# Patient Record
Sex: Male | Born: 1955
Health system: Southern US, Community
[De-identification: ages and names within clinical notes are randomized; demographics above are authoritative.]

## PROBLEM LIST (undated history)

## (undated) DIAGNOSIS — H269 Unspecified cataract: Secondary | ICD-10-CM

## (undated) DIAGNOSIS — M47816 Spondylosis without myelopathy or radiculopathy, lumbar region: Secondary | ICD-10-CM

## (undated) DIAGNOSIS — C801 Malignant (primary) neoplasm, unspecified: Secondary | ICD-10-CM

## (undated) DIAGNOSIS — C4490 Unspecified malignant neoplasm of skin, unspecified: Secondary | ICD-10-CM

## (undated) DIAGNOSIS — T7840XA Allergy, unspecified, initial encounter: Secondary | ICD-10-CM

## (undated) DIAGNOSIS — S22000A Wedge compression fracture of unspecified thoracic vertebra, initial encounter for closed fracture: Secondary | ICD-10-CM

## (undated) DIAGNOSIS — C439 Malignant melanoma of skin, unspecified: Secondary | ICD-10-CM

## (undated) DIAGNOSIS — Z86010 Personal history of colonic polyps: Secondary | ICD-10-CM

## (undated) DIAGNOSIS — T782XXA Anaphylactic shock, unspecified, initial encounter: Secondary | ICD-10-CM

## (undated) DIAGNOSIS — T63481A Toxic effect of venom of other arthropod, accidental (unintentional), initial encounter: Secondary | ICD-10-CM

## (undated) DIAGNOSIS — E119 Type 2 diabetes mellitus without complications: Secondary | ICD-10-CM

## (undated) DIAGNOSIS — R55 Syncope and collapse: Secondary | ICD-10-CM

## (undated) DIAGNOSIS — I10 Essential (primary) hypertension: Secondary | ICD-10-CM

## (undated) DIAGNOSIS — D8940 Mast cell activation, unspecified: Secondary | ICD-10-CM

## (undated) DIAGNOSIS — E785 Hyperlipidemia, unspecified: Secondary | ICD-10-CM

## (undated) DIAGNOSIS — K219 Gastro-esophageal reflux disease without esophagitis: Secondary | ICD-10-CM

## (undated) HISTORY — DX: Spondylosis without myelopathy or radiculopathy, lumbar region: M47.816

## (undated) HISTORY — DX: Gastro-esophageal reflux disease without esophagitis: K21.9

## (undated) HISTORY — DX: Mast cell activation, unspecified: D89.40

## (undated) HISTORY — PX: MOHS SURGERY: SUR867

## (undated) HISTORY — PX: EYE SURGERY: SHX253

## (undated) HISTORY — PX: RETINAL DETACHMENT SURGERY: SHX105

## (undated) HISTORY — DX: Unspecified cataract: H26.9

## (undated) HISTORY — DX: Wedge compression fracture of unspecified thoracic vertebra, initial encounter for closed fracture: S22.000A

## (undated) HISTORY — PX: KNEE SURGERY: SHX244

## (undated) HISTORY — DX: Toxic effect of venom of other arthropod, accidental (unintentional), initial encounter: T78.2XXA

## (undated) HISTORY — PX: SKIN BIOPSY: SHX1

## (undated) HISTORY — PX: APPENDECTOMY: SHX54

## (undated) HISTORY — DX: Allergy, unspecified, initial encounter: T78.40XA

## (undated) HISTORY — PX: LASIK: SHX215

## (undated) HISTORY — PX: CATARACT EXTRACTION: SUR2

## (undated) HISTORY — DX: Hyperlipidemia, unspecified: E78.5

## (undated) HISTORY — PX: COLONOSCOPY: SHX174

## (undated) HISTORY — DX: Toxic effect of venom of other arthropod, accidental (unintentional), initial encounter: T63.481A

## (undated) HISTORY — DX: Malignant melanoma of skin, unspecified: C43.9

---
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
## 1983-03-12 HISTORY — PX: KNEE SURGERY: SHX244

## 2001-02-04 ENCOUNTER — Ambulatory Visit (HOSPITAL_COMMUNITY): Admission: RE | Admit: 2001-02-04 | Discharge: 2001-02-04 | Payer: Self-pay | Admitting: Orthopedic Surgery

## 2001-02-04 ENCOUNTER — Encounter: Payer: Self-pay | Admitting: Orthopedic Surgery

## 2001-03-12 ENCOUNTER — Encounter: Payer: Self-pay | Admitting: Orthopedic Surgery

## 2001-03-12 ENCOUNTER — Encounter: Admission: RE | Admit: 2001-03-12 | Discharge: 2001-03-12 | Payer: Self-pay | Admitting: Orthopedic Surgery

## 2013-03-23 ENCOUNTER — Other Ambulatory Visit: Payer: Self-pay | Admitting: Vascular Surgery

## 2013-03-23 DIAGNOSIS — I70229 Atherosclerosis of native arteries of extremities with rest pain, unspecified extremity: Secondary | ICD-10-CM

## 2013-03-24 ENCOUNTER — Encounter (HOSPITAL_COMMUNITY): Payer: Self-pay

## 2013-03-24 ENCOUNTER — Encounter: Payer: Self-pay | Admitting: Vascular Surgery

## 2014-04-16 ENCOUNTER — Encounter (HOSPITAL_COMMUNITY): Payer: Self-pay | Admitting: *Deleted

## 2014-04-16 ENCOUNTER — Emergency Department (HOSPITAL_COMMUNITY)
Admission: EM | Admit: 2014-04-16 | Discharge: 2014-04-17 | Disposition: A | Discharge status: Home / Self Care | Payer: 59 | Attending: Emergency Medicine | Admitting: Emergency Medicine

## 2014-04-16 DIAGNOSIS — R531 Weakness: Secondary | ICD-10-CM | POA: Diagnosis not present

## 2014-04-16 DIAGNOSIS — E119 Type 2 diabetes mellitus without complications: Secondary | ICD-10-CM | POA: Insufficient documentation

## 2014-04-16 DIAGNOSIS — Z859 Personal history of malignant neoplasm, unspecified: Secondary | ICD-10-CM | POA: Insufficient documentation

## 2014-04-16 DIAGNOSIS — R197 Diarrhea, unspecified: Secondary | ICD-10-CM | POA: Insufficient documentation

## 2014-04-16 DIAGNOSIS — Z7982 Long term (current) use of aspirin: Secondary | ICD-10-CM | POA: Diagnosis not present

## 2014-04-16 DIAGNOSIS — I10 Essential (primary) hypertension: Secondary | ICD-10-CM | POA: Insufficient documentation

## 2014-04-16 DIAGNOSIS — T7840XA Allergy, unspecified, initial encounter: Secondary | ICD-10-CM | POA: Insufficient documentation

## 2014-04-16 DIAGNOSIS — R21 Rash and other nonspecific skin eruption: Secondary | ICD-10-CM | POA: Insufficient documentation

## 2014-04-16 DIAGNOSIS — Z79899 Other long term (current) drug therapy: Secondary | ICD-10-CM | POA: Insufficient documentation

## 2014-04-16 HISTORY — DX: Essential (primary) hypertension: I10

## 2014-04-16 HISTORY — DX: Malignant (primary) neoplasm, unspecified: C80.1

## 2014-04-16 HISTORY — DX: Type 2 diabetes mellitus without complications: E11.9

## 2014-04-16 NOTE — ED Notes (Signed)
Pt reports have 3 bowel movements, (solid, to semi solid, then liquid.) after that he started itching to his scalp & hands. Pt used benadryl & his eppi pen. Pt became weak & dizzy. Per EMS pt BP was low but when got him in truck had returned to normal.

## 2014-04-16 NOTE — ED Notes (Signed)
Pt took 4 asa at home given by his wife.

## 2014-04-16 NOTE — ED Provider Notes (Signed)
CSN: [id number]     Arrival date & time 04/16/14  2312 History   First MD Initiated Contact with Patient 04/16/14 2322     Chief Complaint  Patient presents with  . Weakness  . Allergic Reaction     (Consider location/radiation/quality/duration/timing/severity/associated sxs/prior Treatment) Patient is a 59 y.o. male presenting with allergic reaction. The history is provided by the patient.  Allergic Reaction Presenting symptoms: itching and rash   Severity:  Moderate Prior allergic episodes:  Insect allergies Relieved by:  Antihistamines and epinephrine Worsened by:  Nothing tried  Douglas Nunez is a 59 y.o. male who presents to the ED via EMS for an allergic reaction. He states that he ate dinner and immediately had to have a BM and it was normal. just a few minutes later he had to go again and it was loose. The third time it was like water and then he reports itching to his hands and scalp. He took benadryl and used his epi pen. He began feeling weak and dizzy. EMS reports the patient's BP was low initially but then returned to normal. Patient has had life threating allergic reactions in the past and they all start the same way with scalp itching,hands and arms and then starts on the chest and that is the reason he used his epi pen. Patient reports at this time he is feeling normal and has his energy back. Patient denies feeling short of breath or difficulty swallowing. The itching has improved. The patient's wife reports that the patient became very weak and his BP dropped during the event so she called EMS.   Past Medical History  Diagnosis Date  . Hypertension   . Diabetes mellitus without complication   . Cancer    Past Surgical History  Procedure Laterality Date  . Appendectomy    . Knee surgery    . Skin biopsy     No family history on file. History  Substance Use Topics  . Smoking status: Never Smoker   . Smokeless tobacco: Not on file  . Alcohol Use: Yes   Comment: occ    Review of Systems  Gastrointestinal: Positive for diarrhea. Abdominal pain: cramping before the diarrhea.  Skin: Positive for itching and rash.  generalized weakness that has now resolved Negative except as stated in HPI   Allergies  Bee venom and Nsaids  Home Medications   Prior to Admission medications   Medication Sig Start Date End Date Taking? Authorizing Provider  amLODipine (NORVASC) 5 MG tablet Take 5 mg by mouth daily.   Yes Historical Provider, MD  aspirin 81 MG tablet Take 81 mg by mouth daily.   Yes Historical Provider, MD  hydrochlorothiazide (HYDRODIURIL) 12.5 MG tablet Take 12.5 mg by mouth daily.   Yes Historical Provider, MD  metFORMIN (GLUCOPHAGE) 500 MG tablet Take by mouth daily.   Yes Historical Provider, MD  ranitidine (ZANTAC) 150 MG tablet Take 150 mg by mouth daily.   Yes Historical Provider, MD   BP 144/85 mmHg  Pulse 100  Temp(Src) 98.5 F (36.9 C) (Oral)  Resp 16  Ht 5' 9.5" (1.765 m)  Wt 203 lb (92.08 kg)  BMI 29.56 kg/m2  SpO2 94% Physical Exam  Constitutional: He is oriented to person, place, and time. He appears well-developed and well-nourished. No distress.  HENT:  Head: Normocephalic.  Mouth/Throat: Uvula is midline, oropharynx is clear and moist and mucous membranes are normal.  Eyes: Conjunctivae and EOM are normal.  Neck: Normal  range of motion. Neck supple.  Cardiovascular: Normal rate and regular rhythm.   Pulmonary/Chest: Effort normal. No respiratory distress. He has no wheezes. He has no rales.  Abdominal: Soft. There is no tenderness.  Musculoskeletal: Normal range of motion.  Neurological: He is alert and oriented to person, place, and time. No cranial nerve deficit.  Skin: Skin is warm and dry.  Psychiatric: He has a normal mood and affect. His behavior is normal. Thought content normal.  Nursing note and vitals reviewed.   ED Course  Procedures  I discussed this case with Dr. Leonides Schanz and we will observe the  patient for the next 3 hours to observe for any rebound after his administration of of epinephrine.  MDM  59 y.o. male with allergic reaction prior to arrival to the ED that patient self administered epi pen and benadryl. Stable since arrival to the ED. Will continue to observe for 4 hours s/p epinephrine. Discussed plan with the patient. Care turned over to Dr. Leonides Schanz at [place] am.      Ashley Murrain, NP 04/17/14 [place], DO 04/17/14 [phone]

## 2014-04-17 MED ORDER — PREDNISONE 20 MG PO TABS: 40.0000 mg | ORAL_TABLET | Freq: Every day | ORAL | Status: DC

## 2014-04-17 MED ORDER — PREDNISONE 20 MG PO TABS
40.0000 mg | ORAL_TABLET | Freq: Once | ORAL | Status: AC
  Administered 2014-04-17: 40 mg via ORAL
  Filled 2014-04-17: qty 2

## 2014-04-17 NOTE — Discharge Instructions (Signed)
Epinephrine Injection Epinephrine is a medicine given by injection to temporarily treat an emergency allergic reaction. It is also used to treat severe asthmatic attacks and other lung problems. The medicine helps to enlarge (dilate) the small breathing tubes of the lungs. A life-threatening, sudden allergic reaction that involves the whole body is called anaphylaxis. Because of potential side effects, epinephrine should only be used as directed by your caregiver. RISKS AND COMPLICATIONS Possible side effects of epinephrine injections include:  Chest pain.  Irregular or rapid heartbeat.  Shortness of breath.  Nausea.  Vomiting.  Abdominal pain or cramping.  Sweating.  Dizziness.  Weakness.  Headache.  Nervousness. Report all side effects to your caregiver. HOW TO GIVE AN EPINEPHRINE INJECTION Give the epinephrine injection immediately when symptoms of a severe reaction begin. Inject the medicine into the outer thigh or any available, large muscle. Your caregiver can teach you how to do this. You do not need to remove any clothing. After the injection, call your local emergency services (911 in [place] Even if you improve after the injection, you need to be examined at a hospital emergency department. Epinephrine works quickly, but it also wears off quickly. Delayed reactions can occur. A delayed reaction may be as serious and dangerous as the initial reaction. HOME CARE INSTRUCTIONS  Make sure you and your family know how to give an epinephrine injection.  Use epinephrine injections as directed by your caregiver. Do not use this medicine more often or in larger doses than prescribed.  Always carry your epinephrine injection or anaphylaxis kit with you. This can be lifesaving if you have a severe reaction.  Store the medicine in a cool, dry place. If the medicine becomes discolored or cloudy, dispose of it properly and replace it with new medicine.  Check the expiration date on  your medicine. It may be unsafe to use medicines past their expiration date.  Tell your caregiver about any other medicines you are taking. Some medicines can react badly with epinephrine.  Tell your caregiver about any medical conditions you have, such as diabetes, high blood pressure (hypertension), heart disease, irregular heartbeats, or if you are pregnant. SEEK IMMEDIATE MEDICAL CARE IF:  You have used an epinephrine injection. Call your local emergency services (911 in [place] Even if you improve after the injection, you need to be examined at a hospital emergency department to make sure your allergic reaction is under control. You will also be monitored for adverse effects from the medicine.  You have chest pain.  You have irregular or fast heartbeats.  You have shortness of breath.  You have severe headaches.  You have severe nausea, vomiting, or abdominal cramps.  You have severe pain, swelling, or redness in the area where you gave the injection. Document Released: 02/23/2000 Document Revised: 05/20/2011 Document Reviewed: 11/14/2010 [facility] Patient Information 2015 [place], [place]. This information is not intended to replace advice given to you by your health care provider. Make sure you discuss any questions you have with your health care provider.   Anaphylactic Reaction An anaphylactic reaction is a sudden, severe allergic reaction that involves the whole body. It can be life threatening. A hospital stay is often required. People with asthma, eczema, or hay fever are slightly more likely to have an anaphylactic reaction. CAUSES  An anaphylactic reaction may be caused by anything to which you are allergic. After being exposed to the allergic substance, your immune system becomes sensitized to it. When you are exposed to that allergic substance  again, an allergic reaction can occur. Common causes of an anaphylactic reaction include:  Medicines.  Foods, especially peanuts,  wheat, shellfish, milk, and eggs.  Insect bites or stings.  Blood products.  Chemicals, such as dyes, latex, and contrast material used for imaging tests. SYMPTOMS  When an allergic reaction occurs, the body releases histamine and other substances. These substances cause symptoms such as tightening of the airway. Symptoms often develop within seconds or minutes of exposure. Symptoms may include:  Skin rash or hives.  Itching.  Chest tightness.  Swelling of the eyes, tongue, or lips.  Trouble breathing or swallowing.  Lightheadedness or fainting.  Anxiety or confusion.  Stomach pains, vomiting, or diarrhea.  Nasal congestion.  A fast or irregular heartbeat (palpitations). DIAGNOSIS  Diagnosis is based on your history of recent exposure to allergic substances, your symptoms, and a physical exam. Your caregiver may also perform blood or urine tests to confirm the diagnosis. TREATMENT  Epinephrine medicine is the main treatment for an anaphylactic reaction. Other medicines that may be used for treatment include antihistamines, steroids, and albuterol. In severe cases, fluids and medicine to support blood pressure may be given through an intravenous line (IV). Even if you improve after treatment, you need to be observed to make sure your condition does not get worse. This may require a stay in the hospital. [place] a medical alert bracelet or necklace stating your allergy.  You and your family must learn how to use an anaphylaxis kit or give an epinephrine injection to temporarily treat an emergency allergic reaction. Always carry your epinephrine injection or anaphylaxis kit with you. This can be lifesaving if you have a severe reaction.  Do not drive or perform tasks after treatment until the medicines used to treat your reaction have worn off, or until your caregiver says it is okay.  If you have hives or a rash:  Take medicines as directed by your  caregiver.  You may use an over-the-counter antihistamine (diphenhydramine) as needed.  Apply cold compresses to the skin or take baths in cool water. Avoid hot baths or showers. SEEK MEDICAL CARE IF:   You develop symptoms of an allergic reaction to a new substance. Symptoms may start right away or minutes later.  You develop a rash, hives, or itching.  You develop new symptoms. SEEK IMMEDIATE MEDICAL CARE IF:   You have swelling of the mouth, difficulty breathing, or wheezing.  You have a tight feeling in your chest or throat.  You develop hives, swelling, or itching all over your body.  You develop severe vomiting or diarrhea.  You feel faint or pass out. This is an emergency. Use your epinephrine injection or anaphylaxis kit as you have been instructed. Call your local emergency services (911 in [place] Even if you improve after the injection, you need to be examined at a hospital emergency department. MAKE SURE YOU:   Understand these instructions.  Will watch your condition.  Will get help right away if you are not doing well or get worse. Document Released: 02/25/2005 Document Revised: 03/02/2013 Document Reviewed: 05/29/2011 [facility] Patient Information 2015 [place], [place]. This information is not intended to replace advice given to you by your health care provider. Make sure you discuss any questions you have with your health care provider.

## 2014-04-17 NOTE — ED Notes (Signed)
Discharge instructions given, pt demonstrated teach back and verbal understanding. No concerns voiced.  

## 2014-09-08 ENCOUNTER — Ambulatory Visit (INDEPENDENT_AMBULATORY_CARE_PROVIDER_SITE_OTHER): Payer: 59 | Admitting: Internal Medicine

## 2014-09-08 ENCOUNTER — Other Ambulatory Visit: Payer: Self-pay | Admitting: Internal Medicine

## 2014-09-08 ENCOUNTER — Encounter: Payer: Self-pay | Admitting: Internal Medicine

## 2014-09-08 VITALS — BP 159/97 | HR 81 | Temp 98.2°F | Ht 69.0 in | Wt 202.0 lb

## 2014-09-08 DIAGNOSIS — E119 Type 2 diabetes mellitus without complications: Secondary | ICD-10-CM

## 2014-09-08 DIAGNOSIS — H3320 Serous retinal detachment, unspecified eye: Secondary | ICD-10-CM | POA: Insufficient documentation

## 2014-09-08 DIAGNOSIS — K219 Gastro-esophageal reflux disease without esophagitis: Secondary | ICD-10-CM | POA: Diagnosis not present

## 2014-09-08 DIAGNOSIS — H3570 Unspecified separation of retinal layers: Secondary | ICD-10-CM | POA: Diagnosis not present

## 2014-09-08 DIAGNOSIS — E1310 Other specified diabetes mellitus with ketoacidosis without coma: Secondary | ICD-10-CM

## 2014-09-08 DIAGNOSIS — I152 Hypertension secondary to endocrine disorders: Secondary | ICD-10-CM | POA: Insufficient documentation

## 2014-09-08 DIAGNOSIS — Z794 Long term (current) use of insulin: Secondary | ICD-10-CM | POA: Insufficient documentation

## 2014-09-08 DIAGNOSIS — I10 Essential (primary) hypertension: Secondary | ICD-10-CM | POA: Diagnosis not present

## 2014-09-08 DIAGNOSIS — H3321 Serous retinal detachment, right eye: Secondary | ICD-10-CM

## 2014-09-08 DIAGNOSIS — E1110 Type 2 diabetes mellitus with ketoacidosis without coma: Secondary | ICD-10-CM | POA: Insufficient documentation

## 2014-09-08 DIAGNOSIS — E1159 Type 2 diabetes mellitus with other circulatory complications: Secondary | ICD-10-CM | POA: Insufficient documentation

## 2014-09-08 DIAGNOSIS — E1165 Type 2 diabetes mellitus with hyperglycemia: Secondary | ICD-10-CM | POA: Insufficient documentation

## 2014-09-08 LAB — COMPREHENSIVE METABOLIC PANEL
ALBUMIN: 4.1 g/dL (ref 3.5–5.2)
ALT: 24 U/L (ref 0–53)
AST: 17 U/L (ref 0–37)
Alkaline Phosphatase: 60 U/L (ref 39–117)
BUN: 15 mg/dL (ref 6–23)
CALCIUM: 9.3 mg/dL (ref 8.4–10.5)
CO2: 25 mEq/L (ref 19–32)
Chloride: 101 mEq/L (ref 96–112)
Creat: 0.93 mg/dL (ref 0.50–1.35)
GLUCOSE: 300 mg/dL — AB (ref 70–99)
POTASSIUM: 4 meq/L (ref 3.5–5.3)
SODIUM: 137 meq/L (ref 135–145)
TOTAL PROTEIN: 7 g/dL (ref 6.0–8.3)
Total Bilirubin: 0.5 mg/dL (ref 0.2–1.2)

## 2014-09-08 LAB — CBC WITH DIFFERENTIAL/PLATELET
BASOS PCT: 1 % (ref 0–1)
Basophils Absolute: 0.1 10*3/uL (ref 0.0–0.1)
EOS ABS: 0.2 10*3/uL (ref 0.0–0.7)
EOS PCT: 3 % (ref 0–5)
HCT: 45.3 % (ref 39.0–52.0)
Hemoglobin: 15.4 g/dL (ref 13.0–17.0)
LYMPHS ABS: 2 10*3/uL (ref 0.7–4.0)
Lymphocytes Relative: 34 % (ref 12–46)
MCH: 30.4 pg (ref 26.0–34.0)
MCHC: 34 g/dL (ref 30.0–36.0)
MCV: 89.5 fL (ref 78.0–100.0)
MONO ABS: 0.2 10*3/uL (ref 0.1–1.0)
MONOS PCT: 4 % (ref 3–12)
MPV: 10.1 fL (ref 8.6–12.4)
NEUTROS PCT: 58 % (ref 43–77)
Neutro Abs: 3.5 10*3/uL (ref 1.7–7.7)
PLATELETS: 219 10*3/uL (ref 150–400)
RBC: 5.06 MIL/uL (ref 4.22–5.81)
RDW: 13.2 % (ref 11.5–15.5)
WBC: 6 10*3/uL (ref 4.0–10.5)

## 2014-09-08 NOTE — Progress Notes (Signed)
Patient ID: Douglas Nunez, male   DOB: 1955/04/19, 59 y.o.   MRN: [id number]         [facility] for Infectious Disease  Reason for Consult: Possible parasitic infection of right eye Referring Physician: Dr. Sherlynn Stalls  Patient Active Problem List   Diagnosis Date Noted  . Retinal detachment 09/08/2014    Priority: High  . HTN (hypertension) 09/08/2014  . DM (diabetes mellitus) type 2, uncontrolled, with ketoacidosis 09/08/2014  . GERD (gastroesophageal reflux disease) 09/08/2014    Patient's Medications  New Prescriptions   No medications on file  Previous Medications   AMLODIPINE (NORVASC) 5 MG TABLET    Take 5 mg by mouth daily.   ASPIRIN 81 MG TABLET    Take 81 mg by mouth daily.   HYDROCHLOROTHIAZIDE (HYDRODIURIL) 12.5 MG TABLET    Take 12.5 mg by mouth daily.   METFORMIN (GLUCOPHAGE) 500 MG TABLET    Take by mouth daily.   RANITIDINE (ZANTAC) 150 MG TABLET    Take 150 mg by mouth daily.  Modified Medications   No medications on file  Discontinued Medications   PREDNISONE (DELTASONE) 20 MG TABLET    Take 2 tablets (40 mg total) by mouth daily.    Recommendations: 1. CBC with differential and complete metabolic panel 2. Serologic testing for Taenia, Toxocara and Trichinella  3. Stool for ova and parasite evaluation 4. Follow-up in 3 weeks  Assessment: This is a very unusual story. Unfortunately without actual confirmation that the linear structure was seen is a parasite it is very difficult to know what to do with this information. His travel history does not suggest that he has it at high risk for unusual parasites. There have been rare reports of various types of eye involvement with [place] parasite such as Toxocara, Taenia and Trichinella. I will obtain a CBC with differential to see if he has any evidence of eosinophilia as well as basic chemistries. I will also obtain serologic testing and stool for ova and parasite evaluation.  HPI: Douglas Nunez is a 59 y.o. male who developed sudden change in the vision in his right eye in early May. He noticed floating shadows in his superior visual field. Otherwise his vision was clear. He was seen by his ophthalmologist and retinal images revealed an apparent retinal tear. He is referred to Dr. Sherlynn Stalls who operated on him on 07/21/2014. According to his operative "the subretinal fluid was found to be extraordinarily viscous. Once the subretinal fluid was removed, there was a linear white structure underneath the retina that resembled a code that was observed to be moving. This hematoma and was responding to stimulus. The pneumatosis was observed with the microscope and removed by extracting it from the subretinal space." The specimen was taken directly to the laboratory. Interestingly enough a final report said that no worms, ova or parasites were seen.  He has no known previous infection with worms or parasites. He has worked for 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 During the 1980s and 90s he travels fairly extensively in [place], [place] and [place]. In the past he has also taken occasional trips to [place] and resorts in [place] for scuba diving trips. He has never had any travel to [place], [place], the [place] or [place] countries or [place]. He does not eat sushi or other raw meats or fish. He does like to hunt and fish.  Review of Systems: Constitutional: negative for anorexia, chills, fevers, sweats and weight loss Eyes: positive for as noted in history of present illness Ears, nose, mouth, throat, and face: negative Respiratory: negative Cardiovascular: negative Gastrointestinal: negative Genitourinary:negative    Past Medical History  Diagnosis Date  . Hypertension   . Diabetes mellitus without complication   . Cancer   . Allergy     History  Substance Use Topics  . Smoking status: Never  Smoker   . Smokeless tobacco: Never Used  . Alcohol Use: 0.0 oz/week    0 Standard drinks or equivalent per week     Comment: occ    Family History  Problem Relation Age of Onset  . Diabetes Father    Allergies  Allergen Reactions  . Bee Venom Anaphylaxis  . Nsaids     OBJECTIVE: Blood pressure 159/97, pulse 81, temperature 98.2 F (36.8 C), temperature source Oral, height 5\' 9"  (1.753 m), weight 202 lb (91.627 kg). General: He is healthy-appearing and in no distress. Skin: No rash Lymph nodes: No palpable adenopathy Lungs: Clear Cor: Regular S1 and S2 with no murmurs Abdomen: Soft and nontender with no palpable masses   Microbiology: No results found for this or any previous visit (from the past 240 hour(s)).  Michel Bickers, MD [facility] [place] [facility] [phone] pager   [phone] cell 09/08/2014, 5:48 PM

## 2014-09-09 ENCOUNTER — Other Ambulatory Visit: Payer: 59

## 2014-09-11 LAB — CYSTICERCOSIS AB, IGG BY ELISA
Cysticercosis Ab, IgG: <0.9
Cysticercosis Ab, IgG: <0.9

## 2014-09-13 LAB — OVA AND PARASITE EXAMINATION: OP: NONE SEEN

## 2014-09-14 LAB — TOXOCARA ANTIBODIES, BLD

## 2014-09-21 LAB — OTHER SOLSTAS TEST

## 2014-09-29 ENCOUNTER — Ambulatory Visit: Payer: 59 | Admitting: Internal Medicine

## 2014-10-17 ENCOUNTER — Encounter: Payer: Self-pay | Admitting: Internal Medicine

## 2014-10-17 ENCOUNTER — Ambulatory Visit (INDEPENDENT_AMBULATORY_CARE_PROVIDER_SITE_OTHER): Payer: 59 | Admitting: Internal Medicine

## 2014-10-17 VITALS — BP 158/102 | HR 92 | Temp 98.3°F | Wt 199.0 lb

## 2014-10-17 DIAGNOSIS — H3321 Serous retinal detachment, right eye: Secondary | ICD-10-CM

## 2014-10-17 DIAGNOSIS — H3570 Unspecified separation of retinal layers: Secondary | ICD-10-CM

## 2014-10-17 NOTE — Assessment & Plan Note (Signed)
At this point I cannot determine what the moving, linear structure was that was seen at the time of his recent eye surgery. Direct examination of fluid from the eye did not confirm any evidence of parasites or other infection. No parasites were found in a stool specimen. He has no serologic evidence of past Toxocara, Trichinella or cysticerci infection and no evidence of eosinophilia. I do not feel that there is any further diagnostic evaluation that needs to be done at this time. I doubt that he will develop symptoms related to parasitic infection in the future.

## 2014-10-17 NOTE — Progress Notes (Signed)
Douglas Nunez for Infectious Disease  Patient Active Problem List   Diagnosis Date Noted  . Retinal detachment 09/08/2014    Priority: High  . HTN (hypertension) 09/08/2014  . DM (diabetes mellitus) type 2, uncontrolled, with ketoacidosis 09/08/2014  . GERD (gastroesophageal reflux disease) 09/08/2014    Patient's Medications  New Prescriptions   No medications on file  Previous Medications   AMLODIPINE (NORVASC) 5 MG TABLET    Take 5 mg by mouth daily.   ASPIRIN 81 MG TABLET    Take 81 mg by mouth daily.   HYDROCHLOROTHIAZIDE (HYDRODIURIL) 12.5 MG TABLET    Take 12.5 mg by mouth daily.   METFORMIN (GLUCOPHAGE) 500 MG TABLET    Take by mouth daily.   RANITIDINE (ZANTAC) 150 MG TABLET    Take 150 mg by mouth daily.  Modified Medications   No medications on file  Discontinued Medications   No medications on file    Subjective: Douglas Nunez is in for his routine follow-up visit. developed sudden change in the vision in his right eye in early May. He noticed floating shadows in his superior visual field. Otherwise his vision was clear. He was seen by his ophthalmologist and retinal images revealed an apparent retinal tear. He is referred to Dr. Sherlynn Stalls who operated on him on 07/21/2014. According to his operative "the subretinal fluid was found to be extraordinarily viscous. Once the subretinal fluid was removed, there was a linear white structure underneath the retina that resembled a nematode that was observed to be moving. This nematode was responding to stimulus. The nematode was observed with the microscope and removed by extracting it from the subretinal space." The specimen was taken directly to the laboratory. Interestingly enough a final report said that no worms, ova or parasites were seen. At the time of his initial visit with me one month ago no new visual findings were noted and he had no unusual risk factors for parasitic infection. Other than being worried  about what this means he has not had any new problems since his last visit. He is working with Dr. Leslie Andrea, his primary care physician, to get his blood sugars under better control.  Review of Systems: Pertinent items are noted in HPI.  Past Medical History  Diagnosis Date  . Hypertension   . Diabetes mellitus without complication   . Cancer   . Allergy     History  Substance Use Topics  . Smoking status: Never Smoker   . Smokeless tobacco: Never Used  . Alcohol Use: 0.0 oz/week    0 Standard drinks or equivalent per week     Comment: occ    Family History  Problem Relation Age of Onset  . Diabetes Father     Allergies  Allergen Reactions  . Bee Venom Anaphylaxis  . Nsaids     Objective: Filed Vitals:   10/17/14 1530 10/17/14 1538  BP: 162/99 158/102  Pulse: 87 92  Temp: 98.3 F (36.8 C)   TempSrc: Oral   Weight: 199 lb (90.266 kg)    Body mass index is 29.37 kg/(m^2).  General: He is in no distress Skin: No rash Eyes: Normal external exam Lungs: Clear Cor: Regular S1 and S2 with no murmurs   Lab Results Lab Results  Component Value Date   WBC 6.0 09/08/2014   HGB 15.4 09/08/2014   HCT 45.3 09/08/2014   MCV 89.5 09/08/2014   PLT 219 09/08/2014  no eosinophilia noted  CMP     Component Value Date/Time   NA 137 09/08/2014 1621   K 4.0 09/08/2014 1621   CL 101 09/08/2014 1621   CO2 25 09/08/2014 1621   GLUCOSE 300* 09/08/2014 1621   BUN 15 09/08/2014 1621   CREATININE 0.93 09/08/2014 1621   CALCIUM 9.3 09/08/2014 1621   PROT 7.0 09/08/2014 1621   ALBUMIN 4.1 09/08/2014 1621   AST 17 09/08/2014 1621   ALT 24 09/08/2014 1621   ALKPHOS 60 09/08/2014 1621   BILITOT 0.5 09/08/2014 1621   Toxocara, Trichinella and cysticerci antibodies are all negative Stool examination negative for ova and parasites   Problem List Items Addressed This Visit      High   Retinal detachment - Primary    At this point I cannot determine what the  moving, linear structure was that was seen at the time of his recent eye surgery. Direct examination of fluid from the eye did not confirm any evidence of parasites or other infection. No parasites were found in a stool specimen. He has no serologic evidence of past Toxocara, Trichinella or cysticerci infection and no evidence of eosinophilia. I do not feel that there is any further diagnostic evaluation that needs to be done at this time. I doubt that he will develop symptoms related to parasitic infection in the future.          Michel Bickers, MD [facility] for Infectious [place] [facility] [phone] pager   [phone] cell 10/17/2014, 4:11 PM

## 2015-03-31 ENCOUNTER — Other Ambulatory Visit (HOSPITAL_COMMUNITY)
Admission: RE | Admit: 2015-03-31 | Discharge: 2015-03-31 | Disposition: A | Discharge status: Home / Self Care | Payer: 59 | Source: Ambulatory Visit | Attending: Internal Medicine | Admitting: Internal Medicine

## 2015-03-31 DIAGNOSIS — E785 Hyperlipidemia, unspecified: Secondary | ICD-10-CM | POA: Insufficient documentation

## 2015-03-31 DIAGNOSIS — E119 Type 2 diabetes mellitus without complications: Secondary | ICD-10-CM | POA: Insufficient documentation

## 2015-03-31 LAB — BASIC METABOLIC PANEL
ANION GAP: 9 (ref 5–15)
BUN: 24 mg/dL — ABNORMAL HIGH (ref 6–20)
CALCIUM: 10.1 mg/dL (ref 8.9–10.3)
CHLORIDE: 103 mmol/L (ref 101–111)
CO2: 26 mmol/L (ref 22–32)
Creatinine, Ser: 0.91 mg/dL (ref 0.61–1.24)
GFR calc Af Amer: >60 mL/min (ref >60)
GFR calc non Af Amer: >60 mL/min (ref >60)
GLUCOSE: 166 mg/dL — AB (ref 65–99)
Potassium: 4.7 mmol/L (ref 3.5–5.1)
Sodium: 138 mmol/L (ref 135–145)

## 2015-03-31 LAB — LIPID PANEL
Cholesterol: 185 mg/dL (ref 0–200)
HDL: 37 mg/dL — ABNORMAL LOW (ref >40)
LDL CALC: 117 mg/dL — AB (ref 0–99)
Total CHOL/HDL Ratio: 5 RATIO
Triglycerides: 153 mg/dL — ABNORMAL HIGH (ref <150)
VLDL: 31 mg/dL (ref 0–40)

## 2015-04-01 LAB — HEMOGLOBIN A1C
Hgb A1c MFr Bld: 8 % — ABNORMAL HIGH (ref 4.8–5.6)
Mean Plasma Glucose: 183 mg/dL

## 2016-01-08 ENCOUNTER — Ambulatory Visit (HOSPITAL_COMMUNITY)
Admission: RE | Admit: 2016-01-08 | Discharge: 2016-01-08 | Disposition: A | Discharge status: Home / Self Care | Payer: 59 | Source: Ambulatory Visit | Attending: Family Medicine | Admitting: Family Medicine

## 2016-01-08 ENCOUNTER — Other Ambulatory Visit (HOSPITAL_COMMUNITY): Payer: Self-pay | Admitting: Family Medicine

## 2016-01-08 DIAGNOSIS — M791 Myalgia: Secondary | ICD-10-CM | POA: Diagnosis present

## 2016-01-08 DIAGNOSIS — M545 Low back pain: Secondary | ICD-10-CM | POA: Diagnosis not present

## 2016-01-08 DIAGNOSIS — M7918 Myalgia, other site: Secondary | ICD-10-CM

## 2016-03-11 DIAGNOSIS — R55 Syncope and collapse: Secondary | ICD-10-CM

## 2016-03-11 HISTORY — DX: Syncope and collapse: R55

## 2016-05-13 ENCOUNTER — Encounter (HOSPITAL_COMMUNITY): Payer: Self-pay

## 2016-05-13 ENCOUNTER — Emergency Department (HOSPITAL_COMMUNITY)
Admission: EM | Admit: 2016-05-13 | Discharge: 2016-05-13 | Disposition: A | Discharge status: Home / Self Care | Payer: 59 | Attending: Emergency Medicine | Admitting: Emergency Medicine

## 2016-05-13 DIAGNOSIS — Z7984 Long term (current) use of oral hypoglycemic drugs: Secondary | ICD-10-CM | POA: Insufficient documentation

## 2016-05-13 DIAGNOSIS — R740 Nonspecific elevation of levels of transaminase and lactic acid dehydrogenase [LDH]: Secondary | ICD-10-CM | POA: Diagnosis not present

## 2016-05-13 DIAGNOSIS — R55 Syncope and collapse: Secondary | ICD-10-CM | POA: Insufficient documentation

## 2016-05-13 DIAGNOSIS — I10 Essential (primary) hypertension: Secondary | ICD-10-CM | POA: Diagnosis not present

## 2016-05-13 DIAGNOSIS — Z859 Personal history of malignant neoplasm, unspecified: Secondary | ICD-10-CM | POA: Diagnosis not present

## 2016-05-13 DIAGNOSIS — E119 Type 2 diabetes mellitus without complications: Secondary | ICD-10-CM | POA: Diagnosis not present

## 2016-05-13 DIAGNOSIS — Z7982 Long term (current) use of aspirin: Secondary | ICD-10-CM | POA: Insufficient documentation

## 2016-05-13 DIAGNOSIS — Z79899 Other long term (current) drug therapy: Secondary | ICD-10-CM | POA: Insufficient documentation

## 2016-05-13 DIAGNOSIS — R7989 Other specified abnormal findings of blood chemistry: Secondary | ICD-10-CM

## 2016-05-13 LAB — CBC WITH DIFFERENTIAL/PLATELET
BASOS ABS: 0 10*3/uL (ref 0.0–0.1)
Basophils Relative: 0 %
EOS ABS: 0.1 10*3/uL (ref 0.0–0.7)
Eosinophils Relative: 1 %
HEMATOCRIT: 43.5 % (ref 39.0–52.0)
Hemoglobin: 14.8 g/dL (ref 13.0–17.0)
Lymphocytes Relative: 23 %
Lymphs Abs: 1.7 10*3/uL (ref 0.7–4.0)
MCH: 30.1 pg (ref 26.0–34.0)
MCHC: 34 g/dL (ref 30.0–36.0)
MCV: 88.4 fL (ref 78.0–100.0)
MONOS PCT: 3 %
Monocytes Absolute: 0.2 10*3/uL (ref 0.1–1.0)
NEUTROS ABS: 5.4 10*3/uL (ref 1.7–7.7)
NEUTROS PCT: 73 %
Platelets: 196 10*3/uL (ref 150–400)
RBC: 4.92 MIL/uL (ref 4.22–5.81)
RDW: 12.8 % (ref 11.5–15.5)
WBC: 7.4 10*3/uL (ref 4.0–10.5)

## 2016-05-13 LAB — BASIC METABOLIC PANEL
Anion gap: 11 (ref 5–15)
BUN: 17 mg/dL (ref 6–20)
CO2: 24 mmol/L (ref 22–32)
CREATININE: 1.23 mg/dL (ref 0.61–1.24)
Calcium: 9.5 mg/dL (ref 8.9–10.3)
Chloride: 101 mmol/L (ref 101–111)
Glucose, Bld: 362 mg/dL — ABNORMAL HIGH (ref 65–99)
Potassium: 3.5 mmol/L (ref 3.5–5.1)
Sodium: 136 mmol/L (ref 135–145)

## 2016-05-13 LAB — I-STAT CG4 LACTIC ACID, ED
LACTIC ACID, VENOUS: 3.84 mmol/L — AB (ref 0.5–1.9)
LACTIC ACID, VENOUS: 1.85 mmol/L (ref 0.5–1.9)
Lactic Acid, Venous: 3.95 mmol/L (ref 0.5–1.9)
Lactic Acid, Venous: 2.92 mmol/L (ref 0.5–1.9)

## 2016-05-13 LAB — I-STAT TROPONIN, ED: TROPONIN I, POC: 0 ng/mL (ref 0.00–0.08)

## 2016-05-13 MED ORDER — SODIUM CHLORIDE 0.9 % IV BOLUS (SEPSIS)
1000.0000 mL | Freq: Once | INTRAVENOUS | Status: AC
  Administered 2016-05-13: 1000 mL via INTRAVENOUS

## 2016-05-13 NOTE — ED Triage Notes (Signed)
Patient states he was at home and started to fell like her was having an allergic to an unknown medicine.  Patient then took an unknown amount of benadryl and passed out.  Wife stated she her him fall and checked him.  Found him to be pulseless and began with a sternal rub and then CPR after that.  Lasted under 1 min and patient was A&Ox4 after.  Patient is A&Ox4 here vitals stable.

## 2016-05-13 NOTE — ED Provider Notes (Signed)
[facility] DEPT Provider Note   CSN: [id number] Arrival date & time: 05/13/16  0040  By signing my name below, I, Oleh Genin, attest that this documentation has been prepared under the direction and in the presence of Delora Fuel, MD. Electronically Signed: Oleh Genin, Scribe. 05/13/16. 1:03 AM.   History   Chief Complaint Chief Complaint  Patient presents with  . Loss of Consciousness    HPI [address] [name] is a 61 y.o. male with history of hypertension and diabetes who presents to the ED for evaluation following a suspected allergic reaction. This patient states that approximately 90 minutes ago he was having a diarrheal episode when he had onset of diaphoresis, lightheadedness, and global weakness. He then walked into his kitchen where the wife says he was periodically unresponsive. With medic, he has returned to his baseline level of consciousness. At interview, he denies any rashes. No nausea today. He did take benadryl and aspirin pre-hospital out of concern for a allergic reaction.   The history is provided by the patient. No language interpreter was used.    Past Medical History:  Diagnosis Date  . Allergy   . Cancer   . Diabetes mellitus without complication   . Hypertension     Patient Active Problem List   Diagnosis Date Noted  . Retinal detachment 09/08/2014  . HTN (hypertension) 09/08/2014  . DM (diabetes mellitus) type 2, uncontrolled, with ketoacidosis ([place]) 09/08/2014  . GERD (gastroesophageal reflux disease) 09/08/2014    Past Surgical History:  Procedure Laterality Date  . APPENDECTOMY    . EYE SURGERY    . KNEE SURGERY    . SKIN BIOPSY         Home Medications    Prior to Admission medications   Medication Sig Start Date End Date Taking? Authorizing Provider  amLODipine (NORVASC) 5 MG tablet Take 5 mg by mouth daily.    Historical Provider, MD  aspirin 81 MG tablet Take 81 mg by mouth daily.    Historical Provider, MD    hydrochlorothiazide (HYDRODIURIL) 12.5 MG tablet Take 12.5 mg by mouth daily.    Historical Provider, MD  metFORMIN (GLUCOPHAGE) 500 MG tablet Take by mouth daily.    Historical Provider, MD  ranitidine (ZANTAC) 150 MG tablet Take 150 mg by mouth daily.    Historical Provider, MD    Family History Family History  Problem Relation Age of Onset  . Diabetes Father     Social History Social History  Substance Use Topics  . Smoking status: Never Smoker  . Smokeless tobacco: Never Used  . Alcohol use 0.0 oz/week     Comment: occ     Allergies   Bee venom and Nsaids   Review of Systems Review of Systems  Constitutional: Positive for fatigue.  Gastrointestinal: Negative for nausea.  Neurological: Positive for syncope and light-headedness.  All other systems reviewed and are negative.    Physical Exam Updated Vital Signs BP 144/93 (BP Location: Right Arm)   Pulse 98   Temp 98.2 F (36.8 C) (Oral)   Resp 16   SpO2 98%   Physical Exam  Constitutional: He is oriented to person, place, and time. He appears well-developed and well-nourished.  HENT:  Head: Normocephalic and atraumatic.  Eyes: EOM are normal. Pupils are equal, round, and reactive to light.  Neck: Normal range of motion. Neck supple. No JVD present.  Cardiovascular: Normal rate, regular rhythm and normal heart sounds.   No murmur heard. Pulmonary/Chest:  Effort normal and breath sounds normal. He has no wheezes. He has no rales. He exhibits no tenderness.  Abdominal: Soft. Bowel sounds are normal. He exhibits no distension and no mass. There is no tenderness.  Musculoskeletal: Normal range of motion. He exhibits no edema.  Lymphadenopathy:    He has no cervical adenopathy.  Neurological: He is alert and oriented to person, place, and time. No cranial nerve deficit. He exhibits normal muscle tone. Coordination normal.  Skin: Skin is warm and dry. No rash noted.  Psychiatric: He has a normal mood and affect.  His behavior is normal. Judgment and thought content normal.  Nursing note and vitals reviewed.    ED Treatments / Results  Labs (all labs ordered are listed, but only abnormal results are displayed) Labs Reviewed  BASIC METABOLIC PANEL - Abnormal; Notable for the following:       Result Value   Glucose, Bld 362 (*)    All other components within normal limits  I-STAT CG4 LACTIC ACID, ED - Abnormal; Notable for the following:    Lactic Acid, Venous 3.84 (*)    All other components within normal limits  I-STAT CG4 LACTIC ACID, ED - Abnormal; Notable for the following:    Lactic Acid, Venous 3.95 (*)    All other components within normal limits  I-STAT CG4 LACTIC ACID, ED - Abnormal; Notable for the following:    Lactic Acid, Venous 2.92 (*)    All other components within normal limits  CBC WITH DIFFERENTIAL/PLATELET  I-STAT TROPOININ, ED  I-STAT CG4 LACTIC ACID, ED    EKG  EKG Interpretation  Date/Time:  Monday May 13 2016 00:50:59 EST Ventricular Rate:  98 PR Interval:    QRS Duration: 84 QT Interval:  358 QTC Calculation: 458 R Axis:   71 Text Interpretation:  Sinus rhythm Normal ECG No old tracing to compare Confirmed by [facility]  MD, Afomia Blackley ([id number]) on 05/13/2016 12:53:59 AM       Procedures Procedures (including critical care time)  Medications Ordered in ED Medications - No data to display   Initial Impression / Assessment and Plan / ED Course  I have reviewed the triage vital signs and the nursing notes.  Pertinent labs & imaging results that were available during my care of the patient were reviewed by me and considered in my medical decision making (see chart for details).  Syncope with specks which sound like vasovagal syncope. No evidence of allergic reaction. Old records are reviewed, and he does have prior ED visits for allergy. Screening labs are obtained and his troponin is normal but lactic acid is elevated. Orthostatic vital signs show no significant  change in heart rate or blood pressure. He is given a liter of saline and lactic acid level has not changed. Is given a second liter of saline and lactic acid level has declined significantly. After third liter of saline, lactic acid level is normal and he is feeling achy is back to normal. I am concerned about possible arrhythmia and he is referred to cardiology for follow-up.  Final Clinical Impressions(s) / ED Diagnoses   Final diagnoses:  Syncope, unspecified syncope type  Elevated lactic acid level    New Prescriptions New Prescriptions   No medications on file   I personally performed the services described in this documentation, which was scribed in my presence. The recorded information has been reviewed and is accurate.      Delora Fuel, MD [id number] [phone]

## 2016-05-23 ENCOUNTER — Encounter: Payer: Self-pay | Admitting: Cardiology

## 2016-05-23 ENCOUNTER — Ambulatory Visit (INDEPENDENT_AMBULATORY_CARE_PROVIDER_SITE_OTHER): Payer: 59 | Admitting: Cardiology

## 2016-05-23 VITALS — BP 126/80 | HR 81 | Ht 69.5 in | Wt 198.8 lb

## 2016-05-23 DIAGNOSIS — I10 Essential (primary) hypertension: Secondary | ICD-10-CM

## 2016-05-23 DIAGNOSIS — E119 Type 2 diabetes mellitus without complications: Secondary | ICD-10-CM

## 2016-05-23 DIAGNOSIS — R55 Syncope and collapse: Secondary | ICD-10-CM | POA: Insufficient documentation

## 2016-05-23 NOTE — Patient Instructions (Signed)
Medication Instructions:  The current medical regimen is effective;  continue present plan and medications.  Testing/Procedures: Your physician has requested that you have an echocardiogram. Echocardiography is a painless test that uses sound waves to create images of your heart. It provides your doctor with information about the size and shape of your heart and how well your heart's chambers and valves are working. This procedure takes approximately one hour. There are no restrictions for this procedure.  You will be called with the results of this testing once reviewed by Dr Marlou Porch.  Follow-Up: Follow up as needed after the above testing.  Thank you for choosing [facility]!!

## 2016-05-23 NOTE — Progress Notes (Signed)
Cardiology Office Note    Date:  05/23/2016   ID:  [address] DOB Aug 16, 1955, MRN [id number]  PCP:  Robert Bellow, MD  Cardiologist:   Candee Furbish, MD     History of Present Illness:  Douglas Leung. is a 61 y.o. male here for the evaluation of syncope at the request of Dr. Roxanne Mins.   Has DM, HTN. Sinus infection off of cold 2.5 weeks. Ran out of metforminA few days before but had just recently started. 10:15 stomach. Chicken at 7:15. Had episode of diarrhea then became lightheaded, diaphoretic and weak. After moving to the kitchen, Douglas Nunez noted that Douglas Nunez was briefly unresponsive. 911 called. After medics arrived, Douglas Nunez improved. Prior to syncope, Douglas Nunez took ASA and benadryl thinking that Douglas Nunez was having an allergic reaction. Douglas Nunez has a history of allergy.   In ER, Douglas lactic acid was elevated and Douglas Nunez was given 3 liters of fluid and this improved.   Prior episode in 04/2014 - tried [place] food - stomach....diarrhaea. Prior allergic reaction, Itch, scalp, flushed. Sweats, dizzy. Epi Pen.   Bee sting, turned white, urinated, seizure like, bladder, 8 years ago.   Prior stings with no problem. Bee allergy testing. +.   Mother and father smokers with CAD.   Drives a lot on job. Long days.   Douglas Nunez has not had any further symptoms.  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  Past Medical History:  Diagnosis Date  . Allergy   . Cancer ([place])   . Diabetes mellitus without complication ([place])   . Hypertension     Past Surgical History:  Procedure Laterality Date  . APPENDECTOMY    . EYE SURGERY    . KNEE SURGERY    . SKIN BIOPSY      Current Medications: Outpatient Medications Prior to Visit  Medication Sig Dispense Refill  . amLODipine (NORVASC) 5 MG tablet Take 5 mg by mouth daily.    Marland Kitchen aspirin 81 MG tablet Take 81 mg by mouth daily.    . metFORMIN (GLUCOPHAGE) 500 MG tablet Take 1,000 mg by mouth 2 (two) times daily.     . ranitidine  (ZANTAC) 150 MG tablet Take 150 mg by mouth daily.    . hydrochlorothiazide (HYDRODIURIL) 12.5 MG tablet Take 12.5 mg by mouth daily.     No facility-administered medications prior to visit.      Allergies:   Bee venom and Nsaids   Social History   Social History  . Marital status: Married    Spouse name: N/A  . Number of children: N/A  . Years of education: N/A   Social History Main Topics  . Smoking status: Never Smoker  . Smokeless tobacco: Never Used  . Alcohol use 0.0 oz/week     Comment: occ  . Drug use: No  . Sexual activity: Not Asked   Other Topics Concern  . None   Social History Narrative  . None     Family History:  The patient's family history includes Diabetes in Douglas father. CAD both mom and dad  ROS:   Please see the history of present illness.   Occasional rash on forearm. ROS All other systems reviewed and are negative.   PHYSICAL EXAM:   VS:  BP 126/80   Pulse 81   Ht 5' 9.5" (1.765 m)   Wt 198 lb 12.8 oz (90.2 kg)   SpO2 98%   BMI 28.94 kg/m  GEN: Well nourished, well developed, in no acute distress  HEENT: normal  Neck: no JVD, carotid bruits, or masses Cardiac: RRR; no murmurs, rubs, or gallops,no edema  Respiratory:  clear to auscultation bilaterally, normal work of breathing GI: soft, nontender, nondistended, + BS MS: no deformity or atrophy  Skin: warm and dry, no rash Neuro:  Alert and Oriented x 3, Strength and sensation are intact Psych: euthymic mood, full affect  Wt Readings from Last 3 Encounters:  05/23/16 198 lb 12.8 oz (90.2 kg)  10/17/14 199 lb (90.3 kg)  09/08/14 202 lb (91.6 kg)      Studies/Labs Reviewed:   EKG:  05/13/16 - NSR 98, QTc 497ms otherwise normal .  Recent Labs: 05/13/2016: BUN 17; Creatinine, Ser 1.23; Hemoglobin 14.8; Platelets 196; Potassium 3.5; Sodium 136   Lipid Panel    Component Value Date/Time   CHOL 185 03/31/2015 1630   TRIG 153 (H) 03/31/2015 1630   HDL 37 (L) 03/31/2015 1630    CHOLHDL 5.0 03/31/2015 1630   VLDL 31 03/31/2015 1630   LDLCALC 117 (H) 03/31/2015 1630    Additional studies/ records that were reviewed today include:  ER notes, labs, ECG    ASSESSMENT:    1. Vasovagal syncope   2. Diabetes mellitus with coincident hypertension ([place])      PLAN:  In order of problems listed above:  Vasovagal syncope  - classic prodrome  - dehydration precipitated (lactic acidosis responsive to 4 liters IVF.)  - QTc was electronically calculated at time of ER visit at 434ms but measures out normal.   - Also Douglas Nunez had a to have weak virus, Douglas blood sugar was 350 when EMS arrived, Douglas lactic acidosis was elevated in the ER, Douglas Nunez could've had a degree of diabetic ketoacidosis which precipitated dehydration which then led to Douglas vasovagal spiral.  - We discussed ways to help combat potential future episodes. For instance if Douglas Nunez starts to begin to feel any prodrome, itchy scalp for instance for him, lay down, Trendelenburg position, and sugar-free Gatorade. Try to stay hydrated in general. Try to avoid excessive amounts of caffeine. Douglas Nunez occasionally will drink unsweetened tea at lunch and coffee in the morning.  - Douglas story does not sound like cardiac arrhythmia.  - I do not think that we need to place a 30 day event monitor. I will check an echocardiogram however given Douglas diabetes, age to ensure that Douglas Nunez does not have any structural or functional deficits.  - Douglas Nunez tells me that Douglas Nunez is going to be seen Dr. Buddy Duty soon with endocrinology. This is good.   DM with HTN  - May have had hypergylcemia with associated decreased intravascular volume?  - On statin  - ACE-I for renal protection  Essential hypertension  - BP well controlled.   We will follow-up with results of study, Douglas Nunez will let me know of any further symptoms occur. Once again I wonder also if this "seizure" episode with shaking in some mixed duration was actually a vasovagal event based upon its  history.  Medication Adjustments/Labs and Tests Ordered: Current medicines are reviewed at length with the patient today.  Concerns regarding medicines are outlined above.  Medication changes, Labs and Tests ordered today are listed in the Patient Instructions below. Patient Instructions  Medication Instructions:  The current medical regimen is effective;  continue present plan and medications.  Testing/Procedures: Your physician has requested that you have an echocardiogram. Echocardiography is a painless test that uses sound waves  to create images of your heart. It provides your doctor with information about the size and shape of your heart and how well your heart's chambers and valves are working. This procedure takes approximately one hour. There are no restrictions for this procedure.  You will be called with the results of this testing once reviewed by Dr Marlou Porch.  Follow-Up: Follow up as needed after the above testing.  Thank you for choosing [facility]!!        Signed, Candee Furbish, MD  05/23/2016 9:01 AM    [facility] [place], [place], [place]  [address] Phone: [phone]; Fax: [phone]

## 2016-06-06 ENCOUNTER — Ambulatory Visit (HOSPITAL_COMMUNITY): Payer: 59 | Attending: Cardiovascular Disease

## 2016-06-06 ENCOUNTER — Other Ambulatory Visit: Payer: Self-pay

## 2016-06-06 DIAGNOSIS — R55 Syncope and collapse: Secondary | ICD-10-CM | POA: Diagnosis not present

## 2016-06-06 DIAGNOSIS — I429 Cardiomyopathy, unspecified: Secondary | ICD-10-CM | POA: Insufficient documentation

## 2016-06-06 DIAGNOSIS — I071 Rheumatic tricuspid insufficiency: Secondary | ICD-10-CM | POA: Insufficient documentation

## 2016-06-06 DIAGNOSIS — I10 Essential (primary) hypertension: Secondary | ICD-10-CM | POA: Diagnosis not present

## 2016-06-06 DIAGNOSIS — I371 Nonrheumatic pulmonary valve insufficiency: Secondary | ICD-10-CM | POA: Insufficient documentation

## 2016-06-06 DIAGNOSIS — E119 Type 2 diabetes mellitus without complications: Secondary | ICD-10-CM | POA: Diagnosis not present

## 2016-09-06 ENCOUNTER — Other Ambulatory Visit (HOSPITAL_COMMUNITY): Payer: Self-pay | Admitting: Family Medicine

## 2016-09-06 ENCOUNTER — Other Ambulatory Visit (HOSPITAL_COMMUNITY)
Admission: RE | Admit: 2016-09-06 | Discharge: 2016-09-06 | Disposition: A | Discharge status: Home / Self Care | Payer: 59 | Source: Ambulatory Visit | Attending: Family Medicine | Admitting: Family Medicine

## 2016-09-06 ENCOUNTER — Encounter (HOSPITAL_COMMUNITY): Payer: Self-pay

## 2016-09-06 ENCOUNTER — Ambulatory Visit (HOSPITAL_COMMUNITY)
Admission: RE | Admit: 2016-09-06 | Discharge: 2016-09-06 | Disposition: A | Discharge status: Home / Self Care | Payer: 59 | Source: Ambulatory Visit | Attending: Family Medicine | Admitting: Family Medicine

## 2016-09-06 DIAGNOSIS — R1013 Epigastric pain: Secondary | ICD-10-CM | POA: Insufficient documentation

## 2016-09-06 DIAGNOSIS — K5730 Diverticulosis of large intestine without perforation or abscess without bleeding: Secondary | ICD-10-CM | POA: Diagnosis not present

## 2016-09-06 DIAGNOSIS — I700 Atherosclerosis of aorta: Secondary | ICD-10-CM | POA: Diagnosis not present

## 2016-09-06 LAB — CBC WITH DIFFERENTIAL/PLATELET
BASOS ABS: 0 10*3/uL (ref 0.0–0.1)
BASOS PCT: 1 %
EOS ABS: 0.2 10*3/uL (ref 0.0–0.7)
EOS PCT: 3 %
HCT: 43.5 % (ref 39.0–52.0)
Hemoglobin: 14.9 g/dL (ref 13.0–17.0)
Lymphocytes Relative: 30 %
Lymphs Abs: 1.7 10*3/uL (ref 0.7–4.0)
MCH: 30.2 pg (ref 26.0–34.0)
MCHC: 34.3 g/dL (ref 30.0–36.0)
MCV: 88.1 fL (ref 78.0–100.0)
MONOS PCT: 5 %
Monocytes Absolute: 0.3 10*3/uL (ref 0.1–1.0)
Neutro Abs: 3.4 10*3/uL (ref 1.7–7.7)
Neutrophils Relative %: 61 %
Platelets: 204 10*3/uL (ref 150–400)
RBC: 4.94 MIL/uL (ref 4.22–5.81)
RDW: 12.6 % (ref 11.5–15.5)
WBC: 5.5 10*3/uL (ref 4.0–10.5)

## 2016-09-06 LAB — COMPREHENSIVE METABOLIC PANEL
ALBUMIN: 4 g/dL (ref 3.5–5.0)
ALT: 20 U/L (ref 17–63)
AST: 15 U/L (ref 15–41)
Alkaline Phosphatase: 61 U/L (ref 38–126)
Anion gap: 7 (ref 5–15)
BUN: 15 mg/dL (ref 6–20)
CHLORIDE: 104 mmol/L (ref 101–111)
CO2: 26 mmol/L (ref 22–32)
CREATININE: 0.9 mg/dL (ref 0.61–1.24)
Calcium: 9.3 mg/dL (ref 8.9–10.3)
GFR calc Af Amer: >60 mL/min (ref >60)
Glucose, Bld: 215 mg/dL — ABNORMAL HIGH (ref 65–99)
POTASSIUM: 4 mmol/L (ref 3.5–5.1)
SODIUM: 137 mmol/L (ref 135–145)
Total Bilirubin: 1.2 mg/dL (ref 0.3–1.2)
Total Protein: 7.3 g/dL (ref 6.5–8.1)

## 2016-09-06 MED ORDER — IOPAMIDOL (ISOVUE-300) INJECTION 61%
INTRAVENOUS | Status: AC
  Filled 2016-09-06: qty 30

## 2016-09-06 MED ORDER — IOPAMIDOL (ISOVUE-300) INJECTION 61%
100.0000 mL | Freq: Once | INTRAVENOUS | Status: AC | PRN
  Administered 2016-09-06: 100 mL via INTRAVENOUS

## 2016-09-24 ENCOUNTER — Ambulatory Visit (INDEPENDENT_AMBULATORY_CARE_PROVIDER_SITE_OTHER): Payer: 59 | Admitting: Allergy and Immunology

## 2016-09-24 ENCOUNTER — Encounter: Payer: Self-pay | Admitting: Allergy and Immunology

## 2016-09-24 VITALS — BP 132/70 | HR 72 | Resp 16

## 2016-09-24 DIAGNOSIS — T7800XA Anaphylactic reaction due to unspecified food, initial encounter: Secondary | ICD-10-CM

## 2016-09-24 DIAGNOSIS — T63481D Toxic effect of venom of other arthropod, accidental (unintentional), subsequent encounter: Secondary | ICD-10-CM

## 2016-09-24 DIAGNOSIS — D8940 Mast cell activation, unspecified: Secondary | ICD-10-CM | POA: Diagnosis not present

## 2016-09-24 DIAGNOSIS — T782XXD Anaphylactic shock, unspecified, subsequent encounter: Secondary | ICD-10-CM | POA: Diagnosis not present

## 2016-09-24 MED ORDER — EPINEPHRINE 0.3 MG/0.3ML IJ SOAJ: 0.3000 mg | Freq: Once | INTRAMUSCULAR | 1 refills | Status: AC

## 2016-09-24 MED ORDER — LOSARTAN POTASSIUM 50 MG PO TABS: 50.0000 mg | ORAL_TABLET | Freq: Every day | ORAL | 0 refills | Status: DC

## 2016-09-24 NOTE — Progress Notes (Signed)
Dear Dr. Karie Kirks,  Thank you for referring Douglas Nunez. to the [facility] on 09/24/2016.   Below is a summation of this patient's evaluation and recommendations.  Thank you for your referral. I will keep you informed about this patient's response to treatment.   If you have any questions please do not hesitate to contact me.   Sincerely,  Jiles Prows, MD [facility]   ______________________________________________________________________    NEW PATIENT NOTE  Referring Provider: Lemmie Evens, MD Primary Provider: Lemmie Evens, MD Date of office visit: 09/24/2016    Subjective:   Chief Complaint:  Douglas Nunez. (DOB: 1956-02-14) is a 61 y.o. male who presents to the clinic on 09/24/2016 with a chief complaint of New Patient (Initial Visit) .     HPI: Douglas Nunez presents to this clinic in evaluation of recurrent allergic reactions.  His history dates back several years. Approximately 6 years ago he was stung by a wasp on his temple and developed intense skin itching and a red hue to his skin across his entire head and chest and then developed syncope and a low blood pressure requiring CPR by his wife for which he was transported to the hospital and treated appropriately. Apparently he saw a local allergist and he was found to be allergic to wasp and to some degree yellow jacket and slight honey bee but it does not sound like any immunotherapy was offered at that point in time.  Then in 2016 he ate teriyaki hamburger and developed very itchy scalp and in 2017 he developed itchy skin and bed diarrhea and syncope after eating at a [employer]. Apparently he was skin tested and found to not be allergic to specific food products.  In March of this year he had another episode of syncope associated with explosive diarrhea and redness of the  skin for which he underwent extensive evaluation for both a cardiac and neurological source of this issue with negative diagnostic studies. However, he apparently had an elevated tryptase during one of his evaluations performed by Dr. Buddy Duty.  He does not really have a history of other forms of atopic disease. He does not have any significant GI issues other than the fact that 4 weeks ago he did develop some stomach upset and had a CT scan of his abdomen which apparently was normal and was treated with Protonix and this issue has since resolved. He does have a history of developing redness and itchiness across his body after taking Aleve 6 years ago. He has been nonsteroidal anti-inflammatory drug free since that point in time.  Past Medical History:  Diagnosis Date  . Allergy   . Cancer ([place])   . Diabetes mellitus without complication ([place])   . Hypertension     Past Surgical History:  Procedure Laterality Date  . APPENDECTOMY    . EYE SURGERY    . KNEE SURGERY    . SKIN BIOPSY      Allergies as of 09/24/2016      Reactions   Bee Venom Anaphylaxis   Nsaids Itching, Rash      Medication List      amLODipine 5 MG tablet Commonly known as:  NORVASC Take 5 mg by mouth daily.   aspirin 81 MG tablet Take 81 mg by mouth daily.   EPIPEN 2-PAK 0.3 mg/0.3 mL Soaj injection Generic drug:  EPINEPHrine Inject 0.3 mg into the muscle once.   EPINEPHrine 0.3 mg/0.3 mL Soaj injection Commonly known as:  AUVI-Q Inject 0.3 mLs (0.3 mg total) into the muscle once.   hydrochlorothiazide 25 MG tablet Commonly known as:  HYDRODIURIL Take 12.5 mg by mouth daily.   metFORMIN 500 MG tablet Commonly known as:  GLUCOPHAGE Take 1,000 mg by mouth 2 (two) times daily.   ranitidine 150 MG tablet Commonly known as:  ZANTAC Take 150 mg by mouth daily.        Review of systems negative except as noted in HPI / PMHx or noted below:  Review of Systems  Constitutional: Negative.   HENT:  Negative.   Eyes: Negative.   Respiratory: Negative.   Cardiovascular: Negative.   Gastrointestinal: Negative.   Genitourinary: Negative.   Musculoskeletal: Negative.   Skin: Negative.   Neurological: Negative.   Endo/Heme/Allergies: Negative.   Psychiatric/Behavioral: Negative.     Family History  Problem Relation Age of Onset  . Diabetes Father     Social History   Social History  . Marital status: Married    Spouse name: N/A  . Number of children: N/A  . Years of education: N/A   Occupational History  . Not on file.   Social History Main Topics  . Smoking status: Never Smoker  . Smokeless tobacco: Never Used  . Alcohol use 0.0 oz/week     Comment: occ  . Drug use: No  . Sexual activity: Not on file   Other Topics Concern  . Not on file   Social History Narrative  . No narrative on file    Environmental and Social history  Lives in a house with a dry environment, no animals located inside the household, carpeting in the bedroom, no plastic on the bed, no plastic on the pillow, and no smoking ongoing with inside the household. He works as a Electrical engineer.  Objective:   Vitals:   09/24/16 1442  BP: 132/70  Pulse: 72  Resp: 16        Physical Exam  Constitutional: He is well-developed, well-nourished, and in no distress.  HENT:  Head: Normocephalic. Head is without right periorbital erythema and without left periorbital erythema.  Right Ear: Tympanic membrane, external ear and ear canal normal.  Left Ear: Tympanic membrane, external ear and ear canal normal.  Nose: Nose normal. No mucosal edema or rhinorrhea.  Mouth/Throat: Uvula is midline, oropharynx is clear and moist and mucous membranes are normal. No oropharyngeal exudate.  Eyes: Pupils are equal, round, and reactive to light. Conjunctivae and lids are normal.  Neck: Trachea normal. No tracheal tenderness present. No tracheal deviation present. No  thyromegaly present.  Cardiovascular: Normal rate, regular rhythm, S1 normal, S2 normal and normal heart sounds.   No murmur heard. Pulmonary/Chest: Effort normal and breath sounds normal. No stridor. No tachypnea. No respiratory distress. He has no wheezes. He has no rales. He exhibits no tenderness.  Abdominal: Soft. He exhibits no distension and no mass. There is no hepatosplenomegaly. There is no tenderness. There is no rebound and no guarding.  Musculoskeletal: He exhibits no edema or tenderness.  Lymphadenopathy:       Head (right side): No tonsillar adenopathy present.       Head (left side): No tonsillar adenopathy present.    He has no cervical adenopathy.    He has no axillary adenopathy.  Neurological: He is alert. Gait normal.  Skin: No rash noted.  He is not diaphoretic. No erythema. No pallor. Nails show no clubbing.  Psychiatric: Mood and affect normal.    Diagnostics: Allergy skin tests were not performed.   Results of blood tests forwarded by Dr. Buddy Duty dated 08/02/2016 identified a serum tryptase level of 17.0 g/l, and he had a 24-hour urine analysis for 5-HIAA which was negative.  Review blood tests obtained 09/06/2016 identified normal hepatic and renal function, white blood cell count 5.5 with a eosinophil count of 200, lymphocyte count 1700, hemoglobin 14.9, platelet 204.  Results of an echocardiogram obtained 06/06/2016 identified the following:  - Left ventricle: The cavity size was normal. Wall thickness was   increased in a pattern of mild LVH. Systolic function was normal.   The estimated ejection fraction was in the range of 60% to 65%.   Wall motion was normal; there were no regional wall motion   abnormalities. Left ventricular diastolic function parameters   were normal. - Atrial septum: No defect or patent foramen ovale was identified.  Results of a abdominal CT scan obtained 09/06/2016 identified the following:  Lower chest: Normal  Hepatobiliary:  Normal  Pancreas: Normal  Spleen: Normal  Adrenals/Urinary Tract: Adrenal glands are normal. Left kidney contains lower pole cysts, the largest a parapelvic cyst measuring 2.5 cm in diameter. Right upper pole renal cyst measuring 6.2 cm in diameter. No mass, stone or hydronephrosis. Bladder appears normal.  Stomach/Bowel: There is diverticulosis of the left colon but no evidence of diverticulitis. Few small right colon diverticulae as well. Small bowel appears normal.  Vascular/Lymphatic: Aortic atherosclerosis. No aneurysm. IVC is normal. No retroperitoneal mass or lymphadenopathy.  Reproductive: Normal  Other: No free fluid or air.   Assessment and Plan:    1. 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 Anaphylaxis due to hymenoptera venom, accidental or unintentional, subsequent encounter   3. Anaphylactic shock due to food, initial encounter     1. Auvi-Q 0.3, Benadryl, M.D./ER evaluation for allergic reaction  2. Every day utilize the following medications:   A. loratadine 10 mg tablet twice a day  B. ranitidine 150 mg tablet twice a day  C. continue aspirin daily  3. Need to come off all ACE inhibitors including lisinopril. Replace with losartan 50 mg 1 time per day and check blood pressure  4. Blood - tryptase, alpha gal, Hymenoptera venom panel  5. 24-hour urine for histamine, n-methyl-histamine, prostaglandin F2 alpha  6. Review records concerning Hymenoptera allergy including testing  7. Return to clinic in 4 weeks or earlier if problem  It does appear as though Douglas Nunez has mast cell activating syndrome and may actually have mastocytosis. We will start evaluation by rechecking his tryptase level and urinary histamine levels and urinary prostaglandin levels to measure the extent of his mast cell activity. As well, to be complete we will check for alpha gal syndrome and also investigate his hymenoptera venom hypersensitivity state in more detail. I have  given him a preventative plan to utilize including loratadine and ranitidine every day and will have him use aspirin to help decrease his prostaglandin production. Lisinopril is contraindicated in an individual with recurrent anaphylaxis and mast cell activation syndrome and I replaced that antihypertensive medication with losartan. I will see him back in this clinic in 4 weeks or earlier if there is a problem.  Jiles Prows, MD [facility]

## 2016-09-24 NOTE — Patient Instructions (Addendum)
  1. Auvi-Q 0.3, Benadryl, M.D./ER evaluation for allergic reaction  2. Every day utilize the following medications:   A. loratadine 10 mg tablet twice a day  B. ranitidine 150 mg tablet twice a day  C. continue aspirin daily  3. Need to come off all ACE inhibitors including lisinopril. Replace with losartan 50 mg 1 time per day and check blood pressure  4. Blood - tryptase, alpha gal, Hymenoptera venom panel  5. 24-hour urine for histamine, n-methyl-histamine, prostaglandin F2 alpha  6. Review records concerning Hymenoptera allergy including testing  7. Return to clinic in 4 weeks or earlier if problem

## 2016-09-25 LAB — PROSTAGLANDINS D2, URINE

## 2016-09-25 LAB — ALLERGEN HYMENOPTERA PANEL
Paper Wasp IgE: 2.15 kU/L — ABNORMAL HIGH
White Hornet IgE: 0.57 kU/L — ABNORMAL HIGH
Yellow Hornet IgE: <0.1 kU/L
Yellow Jacket IgE: 1.45 kU/L — ABNORMAL HIGH

## 2016-09-25 LAB — N-METHYLHISTAMINE, URINE

## 2016-09-25 LAB — HISTAMINE, 24 HOUR URINE

## 2016-09-27 LAB — ALPHA-GAL PANEL
Beef IgE: <0.1 kU/L (ref <0.35)
CLASS: 0
Class: 0
Class: 0
Galactose-alpha-1,3-galactose IgE*: 0.24 kU/L (ref <0.35)
Lamb/Mutton IgE: <0.1 kU/L (ref <0.35)

## 2016-09-27 LAB — TRYPTASE: Tryptase: 17.4 ug/L — ABNORMAL HIGH (ref <11)

## 2016-10-02 ENCOUNTER — Other Ambulatory Visit: Payer: Self-pay | Admitting: Allergy and Immunology

## 2016-10-08 LAB — HISTAMINE, 24 HOUR URINE
CREATININE 24 HR UR (HISTAMINE): 2.09 g/(24.h) (ref 0.63–2.50)
Histamine, 24 hour Ur: 0.059 mg/24 h (ref 0.006–0.131)

## 2016-10-11 LAB — N-METHYLHISTAMINE, URINE
CREATININE CONC: 86 mg/dL
Collection Duration: 24 h
N-Methylhistamine, U: 288 mcg/g Cr — ABNORMAL HIGH (ref 30–200)
Urine Volume: 2400 mL

## 2016-10-22 ENCOUNTER — Ambulatory Visit: Payer: 59 | Admitting: Allergy and Immunology

## 2016-10-23 LAB — PROSTAGLANDINS D2, URINE

## 2016-11-17 ENCOUNTER — Other Ambulatory Visit: Payer: Self-pay | Admitting: Allergy and Immunology

## 2016-11-19 ENCOUNTER — Ambulatory Visit (INDEPENDENT_AMBULATORY_CARE_PROVIDER_SITE_OTHER): Payer: 59 | Admitting: Allergy and Immunology

## 2016-11-19 ENCOUNTER — Encounter: Payer: Self-pay | Admitting: Allergy and Immunology

## 2016-11-19 VITALS — BP 140/74 | HR 72 | Resp 14

## 2016-11-19 DIAGNOSIS — T63481D Toxic effect of venom of other arthropod, accidental (unintentional), subsequent encounter: Secondary | ICD-10-CM

## 2016-11-19 DIAGNOSIS — T782XXD Anaphylactic shock, unspecified, subsequent encounter: Secondary | ICD-10-CM | POA: Diagnosis not present

## 2016-11-19 DIAGNOSIS — D8940 Mast cell activation, unspecified: Secondary | ICD-10-CM

## 2016-11-19 MED ORDER — LOSARTAN POTASSIUM 50 MG PO TABS: 50.0000 mg | ORAL_TABLET | Freq: Every day | ORAL | 0 refills | Status: DC

## 2016-11-19 NOTE — Progress Notes (Signed)
Follow-up Note  Referring Provider: No ref. provider found Primary Provider: Patient, No Pcp Per Date of Office Visit: 11/19/2016  Subjective:   Douglas Nunez. (DOB: Jul 26, 1955) is a 61 y.o. male who returns to the [facility] on 11/19/2016 in re-evaluation of the following:  HPI: Carrick returns to this clinic in reevaluation of his mast cell activating syndrome and Hymenoptera venom hypersensitivity state. His last visit to this clinic was his initial evaluation of 09/24/2016.  He has really done very well without any significant reactivity involving any organ systems since he was seen in this clinic. He has consistently been using an H1 and H2 receptor blocker and aspirin.  Allergies as of 11/19/2016      Reactions   Bee Venom Anaphylaxis   Nsaids Itching, Rash      Medication List      amLODipine 5 MG tablet Commonly known as:  NORVASC Take 5 mg by mouth daily.   aspirin 81 MG tablet Take 81 mg by mouth daily.   EPIPEN 2-PAK 0.3 mg/0.3 mL Soaj injection Generic drug:  EPINEPHrine Inject 0.3 mg into the muscle once.   hydrochlorothiazide 25 MG tablet Commonly known as:  HYDRODIURIL Take 12.5 mg by mouth daily.   loratadine 10 MG tablet Commonly known as:  CLARITIN Take 10 mg by mouth 2 (two) times daily.   losartan 50 MG tablet Commonly known as:  COZAAR Take 1 tablet (50 mg total) by mouth daily.   ranitidine 150 MG tablet Commonly known as:  ZANTAC Take 150 mg by mouth daily.   TRULICITY Nina Inject into the skin once a week.       Past Medical History:  Diagnosis Date  . Allergy   . Cancer ([place])   . Diabetes mellitus without complication ([place])   . Hypertension     Past Surgical History:  Procedure Laterality Date  . APPENDECTOMY    . EYE SURGERY    . KNEE SURGERY    . SKIN BIOPSY      Review of systems negative except as noted in HPI / PMHx or noted below:  Review of Systems  Constitutional: Negative.   HENT:  Negative.   Eyes: Negative.   Respiratory: Negative.   Cardiovascular: Negative.   Gastrointestinal: Negative.   Genitourinary: Negative.   Musculoskeletal: Negative.   Skin: Negative.   Neurological: Negative.   Endo/Heme/Allergies: Negative.   Psychiatric/Behavioral: Negative.      Objective:   Vitals:   11/19/16 1121  BP: 140/74  Pulse: 72  Resp: 14          Physical Exam  Constitutional: He is well-developed, well-nourished, and in no distress.  HENT:  Head: Normocephalic.  Right Ear: Tympanic membrane, external ear and ear canal normal.  Left Ear: Tympanic membrane, external ear and ear canal normal.  Nose: Nose normal. No mucosal edema or rhinorrhea.  Mouth/Throat: Uvula is midline, oropharynx is clear and moist and mucous membranes are normal. No oropharyngeal exudate.  Eyes: Conjunctivae are normal.  Neck: Trachea normal. No tracheal tenderness present. No tracheal deviation present. No thyromegaly present.  Cardiovascular: Normal rate, regular rhythm, S1 normal, S2 normal and normal heart sounds.   No murmur heard. Pulmonary/Chest: Breath sounds normal. No stridor. No respiratory distress. He has no wheezes. He has no rales.  Musculoskeletal: He exhibits no edema.  Lymphadenopathy:       Head (right side): No tonsillar adenopathy present.  Head (left side): No tonsillar adenopathy present.    He has no cervical adenopathy.  Neurological: He is alert. Gait normal.  Skin: No rash noted. He is not diaphoretic. No erythema. Nails show no clubbing.  Psychiatric: Mood and affect normal.    Diagnostics: Results of blood tests obtained 09/24/2016 identified a serum tryptase level of 17.4 ug/l, negative alpha gal panel, IgE antibodies directed against white hornet, yellow jacket, and paper wasp. Results of urine test obtained 10/02/2016 identified normal urinary histamine, creatinine, and prostaglandin D2.   Assessment and Plan:   1. Mast cell activation  syndrome ([place])   2. Anaphylaxis due to hymenoptera venom, accidental or unintentional, subsequent encounter     1. Auvi-Q 0.3, Benadryl, M.D./ER evaluation for allergic reaction  2. Every day utilize the following medications:   A. loratadine 10 mg tablet twice a day  B. ranitidine 150 mg tablet twice a day  C. aspirin 81 mg daily  3. Start a course of immunotherapy against mixed vespid and wasp.  4. Obtain fall flu vaccine  5. Return to clinic in 6 months or earlier if problem  Harlen will continue to utilize an H1 and H2 receptor blocker and aspirin for his mast cell activating syndrome and we will start him on a course of immunotherapy for his hymenoptera venom hypersensitivity state. I will see him back in this clinic in 6 months or earlier if there is a problem.  Allena Katz, MD Allergy / Immunology [facility]

## 2016-11-19 NOTE — Patient Instructions (Addendum)
  1. Auvi-Q 0.3, Benadryl, M.D./ER evaluation for allergic reaction  2. Every day utilize the following medications:   A. loratadine 10 mg tablet twice a day  B. ranitidine 150 mg tablet twice a day  C. aspirin 81 mg daily  3. Start a course of immunotherapy against mixed vespid and wasp.  4. Obtain fall flu vaccine  5. Return to clinic in 6 months or earlier if problem

## 2016-11-27 ENCOUNTER — Telehealth: Payer: Self-pay | Admitting: Allergy and Immunology

## 2016-11-27 NOTE — Telephone Encounter (Signed)
FYI

## 2016-11-27 NOTE — Telephone Encounter (Signed)
Called patient. Patient informed me on Saturday 11/23/2016 at 4:00pm is when he had his reaction. Patient had lunch around 12/12:30 and he consumed pizza. Then around 3:45 his stomach started to bubble and he had to visit the restroom and he had loose stool. Then he started to sweat,and feel weak. The patient proceed to take 150mg  Zantac and 10mg  Loratadine. He then laid back in the recliner and called for his wife (his wife is a Marine scientist). When she came in she stated he was pale. Then he had to go back to the restroom and at this time is was watery stool. He blacked out for just a minute per his wife. His wife stated that he had 4 breaths in 60 sec. The wife called 48, patient came to and had enough strength to make it back to the recliner. His blood pressure at that time was 90/74, the 2nd time is was 110/70 64-pulse, the 3rd time right before the EMT got there it was 128/74 with 74-pulse. Then the EMT did blood pressure manually and it was 134/74 98o2, 80-pulse. The EMT stayed for about 10-15 minutes monitoring him. When they left his blood pressure was 140/80 84-pulse. They did not have to administer any medication. Patient is taking a Protonix and wants to know if you want him to continue them.

## 2016-11-27 NOTE — Telephone Encounter (Signed)
PLEASE CALL PATIENT AFTER 10:00 - HAS A MEETING Patient called in stating that he had an episode that he wanted to report Patient states that his "vasovagal histamine level" went up causing him to pass out again.  Patient wanted to make sure that [facility] was aware of this episode.   Please call pt to clarify or answer any questions

## 2016-11-27 NOTE — Telephone Encounter (Signed)
Please clarify with patient the exact date and the description of his reaction and whether or not there was a trigger that may have arisen within 24 hours or longer prior to this reaction.

## 2017-01-24 ENCOUNTER — Encounter (HOSPITAL_COMMUNITY): Payer: Self-pay | Admitting: Emergency Medicine

## 2017-01-24 ENCOUNTER — Other Ambulatory Visit: Payer: Self-pay

## 2017-01-24 ENCOUNTER — Emergency Department (HOSPITAL_COMMUNITY)
Admission: EM | Admit: 2017-01-24 | Discharge: 2017-01-24 | Disposition: A | Discharge status: Home / Self Care | Payer: 59 | Attending: Emergency Medicine | Admitting: Emergency Medicine

## 2017-01-24 DIAGNOSIS — R197 Diarrhea, unspecified: Secondary | ICD-10-CM | POA: Diagnosis not present

## 2017-01-24 DIAGNOSIS — Z85828 Personal history of other malignant neoplasm of skin: Secondary | ICD-10-CM | POA: Diagnosis not present

## 2017-01-24 DIAGNOSIS — E119 Type 2 diabetes mellitus without complications: Secondary | ICD-10-CM | POA: Insufficient documentation

## 2017-01-24 DIAGNOSIS — R110 Nausea: Secondary | ICD-10-CM | POA: Diagnosis not present

## 2017-01-24 DIAGNOSIS — Z7984 Long term (current) use of oral hypoglycemic drugs: Secondary | ICD-10-CM | POA: Diagnosis not present

## 2017-01-24 DIAGNOSIS — Z7982 Long term (current) use of aspirin: Secondary | ICD-10-CM | POA: Diagnosis not present

## 2017-01-24 DIAGNOSIS — Z79899 Other long term (current) drug therapy: Secondary | ICD-10-CM | POA: Insufficient documentation

## 2017-01-24 DIAGNOSIS — I10 Essential (primary) hypertension: Secondary | ICD-10-CM | POA: Insufficient documentation

## 2017-01-24 HISTORY — DX: Unspecified malignant neoplasm of skin, unspecified: C44.90

## 2017-01-24 LAB — URINALYSIS, ROUTINE W REFLEX MICROSCOPIC
BILIRUBIN URINE: NEGATIVE
Bacteria, UA: NONE SEEN
GLUCOSE, UA: NEGATIVE mg/dL
HGB URINE DIPSTICK: NEGATIVE
Ketones, ur: NEGATIVE mg/dL
LEUKOCYTES UA: NEGATIVE
NITRITE: NEGATIVE
PH: 5 (ref 5.0–8.0)
Protein, ur: 30 mg/dL — AB
SPECIFIC GRAVITY, URINE: 1.028 (ref 1.005–1.030)

## 2017-01-24 MED ORDER — ONDANSETRON HCL 4 MG/2ML IJ SOLN
4.0000 mg | Freq: Once | INTRAMUSCULAR | Status: AC
  Administered 2017-01-24: 4 mg via INTRAVENOUS

## 2017-01-24 MED ORDER — SODIUM CHLORIDE 0.9 % IV BOLUS (SEPSIS)
1000.0000 mL | Freq: Once | INTRAVENOUS | Status: AC
  Administered 2017-01-24: 1000 mL via INTRAVENOUS

## 2017-01-24 MED ORDER — ONDANSETRON HCL 4 MG PO TABS: 4.0000 mg | ORAL_TABLET | Freq: Three times a day (TID) | ORAL | 0 refills | Status: DC | PRN

## 2017-01-24 MED ORDER — ONDANSETRON HCL 4 MG/2ML IJ SOLN
4.0000 mg | Freq: Once | INTRAMUSCULAR | Status: AC
  Administered 2017-01-24: 4 mg via INTRAVENOUS
  Filled 2017-01-24: qty 2

## 2017-01-24 MED ORDER — ONDANSETRON HCL 4 MG/2ML IJ SOLN
INTRAMUSCULAR | Status: AC
  Administered 2017-01-24: 4 mg via INTRAVENOUS
  Filled 2017-01-24: qty 2

## 2017-01-24 MED ORDER — LACTATED RINGERS IV BOLUS (SEPSIS)
1000.0000 mL | Freq: Once | INTRAVENOUS | Status: AC
  Administered 2017-01-24: 1000 mL via INTRAVENOUS

## 2017-01-24 NOTE — ED Notes (Signed)
Pt given sprite 

## 2017-01-24 NOTE — ED Provider Notes (Signed)
[facility] EMERGENCY DEPARTMENT Provider Note   CSN: [id number] Arrival date & time: 01/24/17  1851     History   Chief Complaint Chief Complaint  Patient presents with  . Diarrhea    HPI [facility] Douglas Nunez. is a 61 y.o. male.  HPI  The patient is a 61 year old male, history of diabetes and hypertension, presents to the hospital today with a complaint of approximately 12 hours of mild abdominal cramping with associated voluminous amounts of watery diarrhea and nausea that is preventing him from taking in adequate fluids.  He reports that he and several other people that ate at the restaurant with him last night all began to have similar symptoms though his seem to be the worst.  Other people had an upset stomach but he had frank watery diarrhea that developed over the course of the evening.  Approximately 5 episodes, associated nausea but no vomiting.  He is taking in very little liquids.  The patient does have a history of multiple episodes of vasovagal syncope associated with large bowel movements, he has not had 1 of those today.  Past Medical History:  Diagnosis Date  . Allergy   . Cancer ([place])   . Diabetes mellitus without complication ([place])   . Hypertension   . Skin cancer     Patient Active Problem List   Diagnosis Date Noted  . Vasovagal syncope 05/23/2016  . Retinal detachment 09/08/2014  . Diabetes mellitus with coincident hypertension ([place]) 09/08/2014  . DM (diabetes mellitus) type 2, uncontrolled, with ketoacidosis ([place]) 09/08/2014  . GERD (gastroesophageal reflux disease) 09/08/2014    Past Surgical History:  Procedure Laterality Date  . APPENDECTOMY    . EYE SURGERY    . KNEE SURGERY    . SKIN BIOPSY         Home Medications    Prior to Admission medications   Medication Sig Start Date End Date Taking? Authorizing Provider  amLODipine (NORVASC) 5 MG tablet Take 5 mg every morning by mouth.    Yes [provider]  aspirin 81 MG  tablet Take 81 mg by mouth daily.   Yes [provider]  Dulaglutide (TRULICITY) 1.5 FB/9.0XY SOPN Inject 1.5 mg every Sunday into the skin.   Yes [provider]  EPINEPHrine (EPIPEN 2-PAK) 0.3 mg/0.3 mL IJ SOAJ injection Inject 0.3 mg into the muscle once.   Yes [provider]  loperamide (IMODIUM A-D) 2 MG tablet Take 2 mg 4 (four) times daily as needed by mouth for diarrhea or loose stools.   Yes [provider]  loratadine (CLARITIN) 10 MG tablet Take 10 mg by mouth 2 (two) times daily.   Yes [provider]  losartan (COZAAR) 50 MG tablet Take 1 tablet (50 mg total) by mouth daily. Patient taking differently: Take 50 mg every morning by mouth.  11/19/16  Yes Kozlow, Donnamarie Poag, MD  pantoprazole (PROTONIX) 40 MG tablet Take 40 mg every evening by mouth.   Yes [provider]  ranitidine (ZANTAC) 150 MG tablet Take 75 mg 2 (two) times daily by mouth.    Yes [provider]  ondansetron (ZOFRAN) 4 MG tablet Take 1 tablet (4 mg total) every 8 (eight) hours as needed by mouth for nausea or vomiting. 01/24/17   Noemi Chapel, MD    Family History Family History  Problem Relation Age of Onset  . Diabetes Father     Social History Social History   Tobacco Use  . Smoking  status: Never Smoker  . Smokeless tobacco: Never Used  Substance Use Topics  . Alcohol use: Yes    Alcohol/week: 0.0 oz    Comment: occ  . Drug use: No     Allergies   Bee venom and Nsaids   Review of Systems Review of Systems  All other systems reviewed and are negative.    Physical Exam Updated Vital Signs BP (!) 146/87   Pulse 89   Temp 98.3 F (36.8 C) (Oral)   Resp 18   Ht 5' 9.5" (1.765 m)   Wt 82.6 kg (182 lb)   SpO2 97%   BMI 26.49 kg/m   Physical Exam  Constitutional: He appears well-developed and well-nourished. No distress.  HENT:  Head: Normocephalic and atraumatic.  Mouth/Throat: No oropharyngeal exudate.  Mucous membranes  are dry  Eyes: Conjunctivae and EOM are normal. Pupils are equal, round, and reactive to light. Right eye exhibits no discharge. Left eye exhibits no discharge. No scleral icterus.  Neck: Normal range of motion. Neck supple. No JVD present. No thyromegaly present.  Cardiovascular: Regular rhythm, normal heart sounds and intact distal pulses. Exam reveals no gallop and no friction rub.  No murmur heard. Pulse is approximately 100  Pulmonary/Chest: Effort normal and breath sounds normal. No respiratory distress. He has no wheezes. He has no rales.  Abdominal: Soft. He exhibits no distension and no mass. There is tenderness.  Minimal periumbilical tenderness, increased bowel sounds  Musculoskeletal: Normal range of motion. He exhibits no edema or tenderness.  Lymphadenopathy:    He has no cervical adenopathy.  Neurological: He is alert. Coordination normal.  Skin: Skin is warm and dry. No rash noted. No erythema.  Psychiatric: He has a normal mood and affect. His behavior is normal.  Nursing note and vitals reviewed.    ED Treatments / Results  Labs (all labs ordered are listed, but only abnormal results are displayed) Labs Reviewed  URINALYSIS, ROUTINE W REFLEX MICROSCOPIC - Abnormal; Notable for the following components:      Result Value   Color, Urine AMBER (*)    APPearance HAZY (*)    Protein, ur 30 (*)    Squamous Epithelial / LPF 0-5 (*)    All other components within normal limits     Radiology No results found.  Procedures Procedures (including critical care time)  Medications Ordered in ED Medications  lactated ringers bolus 1,000 mL (1,000 mLs Intravenous New Bag/Given 01/24/17 2157)  sodium chloride 0.9 % bolus 1,000 mL (0 mLs Intravenous Stopped 01/24/17 2107)  ondansetron (ZOFRAN) injection 4 mg (4 mg Intravenous Given 01/24/17 2027)  ondansetron (ZOFRAN) injection 4 mg (4 mg Intravenous Given 01/24/17 2201)     Initial Impression / Assessment and Plan / ED  Course  I have reviewed the triage vital signs and the nursing notes.  Pertinent labs & imaging results that were available during my care of the patient were reviewed by me and considered in my medical decision making (see chart for details).     The patient has minimal findings on exam of concern though he does appear dehydrated from lots of fluid losses today.  As he is not taking oral fluids very well he will need IV fluids and IV Zofran, the patient is agreeable.  Patient has had recurrent diarrhea several times, his vital signs remain in a stable range, he is received 2 L of IV fluid and tolerated oral fluids without difficulty.  He appears stable for discharge.  Vitals:   01/24/17 1910 01/24/17 2206  BP: (!) 131/99 (!) 146/87  Pulse: (!) 124 89  Resp: 18 18  Temp: 98.3 F (36.8 C)   TempSrc: Oral   SpO2: 98% 97%  Weight: 82.6 kg (182 lb)   Height: 5' 9.5" (1.765 m)      Final Clinical Impressions(s) / ED Diagnoses   Final diagnoses:  Diarrhea, unspecified type    ED Discharge Orders        Ordered    ondansetron (ZOFRAN) 4 MG tablet  Every 8 hours PRN     01/24/17 2301       Noemi Chapel, MD 01/24/17 [phone]

## 2017-01-24 NOTE — ED Notes (Signed)
Pt has had 2 episodes of diarrhea.

## 2017-01-24 NOTE — ED Triage Notes (Signed)
Pt c/o nausea and diarrhea since eating at seafood restaurant last night. Pt states he has had 5 episodes in 10 hours. Pt has taken imodium and drinking Pedialyte.

## 2017-01-24 NOTE — Discharge Instructions (Signed)
Take Zofran as needed for nausea or vomiting, drink plenty of fluids including Gatorade, read the attached instructions.  Seek medical attention for severe or worsening pain or fever or bloody diarrhea.

## 2017-01-29 MED FILL — Ondansetron HCl Tab 4 MG: ORAL | Qty: 4 | Status: AC

## 2017-02-18 ENCOUNTER — Other Ambulatory Visit: Payer: Self-pay | Admitting: Allergy and Immunology

## 2017-03-18 ENCOUNTER — Ambulatory Visit: Payer: 59 | Admitting: *Deleted

## 2017-03-18 ENCOUNTER — Encounter: Payer: Self-pay | Admitting: Registered"

## 2017-03-18 ENCOUNTER — Encounter: Payer: 59 | Attending: Internal Medicine | Admitting: Registered"

## 2017-03-18 DIAGNOSIS — I10 Essential (primary) hypertension: Secondary | ICD-10-CM

## 2017-03-18 DIAGNOSIS — E1165 Type 2 diabetes mellitus with hyperglycemia: Secondary | ICD-10-CM | POA: Diagnosis not present

## 2017-03-18 DIAGNOSIS — E119 Type 2 diabetes mellitus without complications: Secondary | ICD-10-CM

## 2017-03-18 DIAGNOSIS — Z713 Dietary counseling and surveillance: Secondary | ICD-10-CM | POA: Diagnosis not present

## 2017-03-18 NOTE — Patient Instructions (Signed)
Plan:  Aim for 4-5 Carb Choices per meal (60-75 grams)  Aim for 0-2 Carb Choices per snack if hungry (0-30 grams) Include protein with your meals and snacks Fairlife milk is a lactose free, high protein option that helps to balance cereal Nuts, beans are great sources of figer Aim for 3 balanced meals per day, with not to heavy of a supper meal and not too late Consider reading food labels for Total Carbohydrate and Saturated Fat Grams (single digits of foods) Continue your activity level daily as tolerated Consider checking blood sugar at alternate times per day especially if interested how specific meal is affecting your blood sugar Continue taking medication as directed by MD Adequate and restful sleep is important for your health and controlling blood sugar

## 2017-03-18 NOTE — Progress Notes (Signed)
Diabetes Self-Management Education  Visit Type: First/Initial  Appt. Start Time: 1110 Appt. End Time: 1240  03/18/2017  [name], identified by name and date of birth, is a 62 y.o. male with a diagnosis of Diabetes: Type 2.   ASSESSMENT Patient states since the 3 yrs he has been diagnosed with T2DM, his A1c has gone up and down (11.2 - 6.4) mostly by adjusting medication. Pt states Dr. Buddy Duty just stopped Trulicity due to GI distress and he will be picking up new prescription for Jardiance today. Although his BG has been in a pretty good range, patient states he would like to know why his morning fasting numbers are higher. RD provided education on this topic.  Pt states his doctor would like to also lower his cholesterol and patient states he will be starting on a statin medication. Pt reports his doctor wants his LDL to be less than 99.   Patient states he gets 8 hrs, falls asleep in recliner (solid sleep) then wakes up and may read for an hour before going to bed for the night. Patient rates stress at 3 out of 10, but a year ago work was a lot more stressful.  Patient states he stopped drinking milk when he developed lactose intolerance several years ago, but did enjoy drinking milk.  Diabetes Self-Management Education - 03/18/17 1116      Visit Information   Visit Type  First/Initial      Initial Visit   Diabetes Type  Type 2    Are you currently following a meal plan?  No    Are you taking your medications as prescribed?  Yes    Date Diagnosed  3 yrs ago      Health Coping   How would you rate your overall health?  Good      Psychosocial Assessment   Patient Belief/Attitude about Diabetes  Motivated to manage diabetes    How often do you need to have someone help you when you read instructions, pamphlets, or other written materials from your doctor or pharmacy?  1 - Never    What is the last grade level you completed in school?  2nd year college      Complications   Last HgB A1C per patient/outside source  6.4 %    How often do you check your blood sugar?  1-2 times/day    Fasting Blood glucose range (mg/dL)  -- 94-130, 120-140 am    Postprandial Blood glucose range (mg/dL)  70-129;130-179 1 hr PPG 120-130 since has been on Trulicity    Number of hypoglycemic episodes per month  0    Number of hyperglycemic episodes per week  0    Have you had a dilated eye exam in the past 12 months?  Yes    Have you had a dental exam in the past 12 months?  Yes    Are you checking your feet?  Yes    How many days per week are you checking your feet?  2      Dietary Intake   Breakfast  4:30 am 2 eggs, sometimes tortilla weekends has grits    Snack (morning)  none OR apple or almonds    Lunch  chicken or seafood, starch (sm fry)    Snack (afternoon)  none    Dinner  chicken or seafood, side salad, lettuce tomato cheese)broccoli or green beans    Snack (evening)  sweet fan, but can stop at 2 small bite size  cookies OR 1/3 adkins bar    Beverage(s)  water or unsweet tea       Exercise   Exercise Type  Light (walking / raking leaves)    How many days per week to you exercise?  4    How many minutes per day do you exercise?  30    Total minutes per week of exercise  120      Patient Education   Previous Diabetes Education  No    Disease state   Definition of diabetes, type 1 and 2, and the diagnosis of diabetes;Factors that contribute to the development of diabetes    Nutrition management   Carbohydrate counting;Role of diet in the treatment of diabetes and the relationship between the three main macronutrients and blood glucose level;Food label reading, portion sizes and measuring food.    Physical activity and exercise   Role of exercise on diabetes management, blood pressure control and cardiac health.    Medications  Reviewed patients medication for diabetes, action, purpose, timing of dose and side effects.    Monitoring  Identified appropriate SMBG and/or A1C  goals.    Acute complications  Taught treatment of hypoglycemia - the 15 rule.;Discussed and identified patients' treatment of hyperglycemia.    Chronic complications  Relationship between chronic complications and blood glucose control    Psychosocial adjustment  Role of stress on diabetes      Individualized Goals (developed by patient)   Nutrition  General guidelines for healthy choices and portions discussed    Physical Activity  Exercise 3-5 times per week    Monitoring   test my blood glucose as discussed      Outcomes   Expected Outcomes  Demonstrated interest in learning. Expect positive outcomes    Future DMSE  PRN    Program Status  Completed     Individualized Plan for Diabetes Self-Management Training:   Learning Objective:  Patient will have a greater understanding of diabetes self-management. Patient education plan is to attend individual and/or group sessions per assessed needs and concerns.   Plan:   Patient Instructions  Plan:  Aim for 4-5 Carb Choices per meal (60-75 grams)  Aim for 0-2 Carb Choices per snack if hungry (0-30 grams) Include protein with your meals and snacks Fairlife milk is a lactose free, high protein option that helps to balance cereal Nuts, beans are great sources of figer Aim for 3 balanced meals per day, with not to heavy of a supper meal and not too late Consider reading food labels for Total Carbohydrate and Saturated Fat Grams (single digits of foods) Continue your activity level daily as tolerated Consider checking blood sugar at alternate times per day especially if interested how specific meal is affecting your blood sugar Continue taking medication as directed by MD Adequate and restful sleep is important for your health and controlling blood sugar  Expected Outcomes:  Demonstrated interest in learning. Expect positive outcomes  Education material provided: Living Well with Diabetes, A1C conversion sheet, My Plate, Snack sheet and  Carbohydrate counting sheet, Cereal Options, Morning high BG, Omega-3 Fish options, Types of Sweeteners  If problems or questions, patient to contact team via:  Phone  Future DSME appointment: PRN

## 2017-05-20 ENCOUNTER — Ambulatory Visit: Payer: 59 | Admitting: Allergy and Immunology

## 2017-06-05 ENCOUNTER — Other Ambulatory Visit: Payer: Self-pay | Admitting: Allergy and Immunology

## 2017-08-27 ENCOUNTER — Other Ambulatory Visit: Payer: Self-pay | Admitting: Allergy and Immunology

## 2018-08-25 ENCOUNTER — Ambulatory Visit (INDEPENDENT_AMBULATORY_CARE_PROVIDER_SITE_OTHER): Payer: 59 | Admitting: Internal Medicine

## 2018-08-25 ENCOUNTER — Encounter: Payer: Self-pay | Admitting: Internal Medicine

## 2018-08-25 ENCOUNTER — Other Ambulatory Visit: Payer: Self-pay

## 2018-08-25 VITALS — Ht 70.0 in | Wt 185.0 lb

## 2018-08-25 DIAGNOSIS — Z1211 Encounter for screening for malignant neoplasm of colon: Secondary | ICD-10-CM

## 2018-08-25 DIAGNOSIS — D8940 Mast cell activation, unspecified: Secondary | ICD-10-CM | POA: Diagnosis not present

## 2018-08-25 DIAGNOSIS — Z87898 Personal history of other specified conditions: Secondary | ICD-10-CM

## 2018-08-25 DIAGNOSIS — E119 Type 2 diabetes mellitus without complications: Secondary | ICD-10-CM

## 2018-08-25 NOTE — Patient Instructions (Signed)
I am glad we were able to have our discussion and answer your questions today.  I look forward to meeting you in person at your colonoscopy which will be scheduled for colon cancer screening.   I know you have some appropriate concerns about diarrhea and the episode of fainting before, I would encourage you to hydrate well in the days before your colonoscopy.  It is very unlikely that you would pass out related to the colonoscopy prep as best I can tell at this point, but if at any point you feel unwell you will always be able to call and speak to 1 of the physicians in our practice.   I appreciate the opportunity to care for you. Gatha Mayer, MD, [facility]

## 2018-08-25 NOTE — Progress Notes (Signed)
TELEHEALTH ENCOUNTER IN SETTING OF COVID-19 PANDEMIC - REQUESTED BY PATIENT SERVICE PROVIDED BY TELEMEDECINE - TYPE: Doximity A/V PATIENT LOCATION: work PATIENT HAS CONSENTED TO TELEHEALTH VISIT PROVIDER LOCATION: OFFICE REFERRING PROVIDER:self PARTICIPANTS OTHER THAN PATIENT:none TIME SPENT ON CALL: 12 ins 25 secs MD - Patient time, 9 minutes other MD time    [name]. 63 y.o. 22-Jan-1956 [id number]  Assessment & Plan:   Encounter Diagnoses  Name Primary?  . Colon cancer screening Yes  . Diabetes mellitus type 2 in nonobese (HCC)   . 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 History of vasovagal syncope     We had a conversation, over half of the visit was spent in counseling and coordination of care, about the methods of colon cancer screening that included annual Hemoccult testing, every 3-year Cologuard and a colonoscopy every 10 years if negative.  I explained in detail the methods of polyp removal.  We discussed my complication record of approximately 8 serious injuries requiring hospitalization in about 20,000 colonoscopies, 5 of those patients required surgery 3 did not and all survived.  We reviewed the other risks of bleeding infection reactions to medication and how we could not predict some problems.  I do not think that taking a MiraLAX and Gatorade prep with Dulcolax should trigger vasovagal simply be related to diarrhea.  I will ask him to hydrate more in the days leading up to it.  We will modify his diabetic regimen as per our protocols and I want him to continue taking all of his other medications particularly those that reduce his risk of a mast cell activation syndrome problem.  After hearing all of this he is decided to schedule a screening colonoscopy.  I appreciate the opportunity to care for this patient. CC: Douglas Evens, MD    Subjective:   Chief Complaint: Discuss colonoscopy for colon cancer screening  HPI Douglas Nunez is a 63 year old  married white man (his wife Douglas Nunez is my patient), an Agricultural consultant for the [employer], who wants to discuss colon cancer screening via colonoscopy.  He has some concerns because he has had some medical problems that include vasovagal syncope associated with diarrhea in 2018 as well as an anaphylaxis syndrome with hymenoptera and he has a diagnosis of mast cell activation syndrome.  That seems to be under control he avoids Aleve, he is on a medication regimen prescribed by Dr. Neldon Nunez.  He manages his diabetes without insulin.  So far we know he has had problems with bee venom, Trulicity and Aleve.  He tolerates a baby aspirin which he takes.  We discussed colonoscopy and its indication benefits and risks as outlined above. Allergies  Allergen Reactions  . Bee Venom Anaphylaxis  . Trulicity [Dulaglutide] Other (See Comments)    Belching, stomach cramps, diarrhea  . Nsaids Itching and Rash   Current Meds  Medication Sig  . amLODipine (NORVASC) 5 MG tablet Take 5 mg every morning by mouth.   Marland Kitchen aspirin 81 MG tablet Take 81 mg by mouth daily.  . empagliflozin (JARDIANCE) 10 MG TABS tablet Take 10 mg by mouth daily.  Marland Kitchen EPINEPHrine (EPIPEN 2-PAK) 0.3 mg/0.3 mL IJ SOAJ injection Inject 0.3 mg into the muscle once.  . liraglutide (VICTOZA) 18 MG/3ML SOPN Inject into the skin. Injected 1.2 mg daily  . loratadine (CLARITIN) 10 MG tablet Take 10 mg by mouth 2 (two) times daily.  Marland Kitchen losartan (COZAAR) 50 MG tablet TAKE 1 TABLET BY MOUTH  DAILY  .  pantoprazole (PROTONIX) 40 MG tablet Take 40 mg by mouth 2 (two) times daily before a meal.    Past Medical History:  Diagnosis Date  . Allergy   . Anaphylaxis due to hymenoptera venom   . Compression fracture of thoracic vertebra (HCC) T11    seen on CT  . Degenerative joint disease (DJD) of lumbar spine   . Diabetes mellitus without complication ([place])   . Hypertension   . 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 Skin cancer    several places removed, and one  Moths surgery on right temple  . Vasovagal syncope 2018   associated with diarrhea, hyperglycemia   Past Surgical History:  Procedure Laterality Date  . APPENDECTOMY     age 83  . CATARACT EXTRACTION Bilateral    and lens implanted  . KNEE SURGERY Left   . LASIK Bilateral   . MOHS SURGERY Right    temple  . RETINAL DETACHMENT SURGERY Right   . SKIN BIOPSY     multiple skin cancers removed   Social History   Social History Narrative   Married to Douglas Nunez   Never smoker   Rare EtOH   No drugs   family history includes Diabetes in his father; Heart attack in his father; Lung cancer in his mother.   Review of Systems Otherwise negative

## 2018-09-07 ENCOUNTER — Encounter: Payer: 59 | Admitting: Internal Medicine

## 2018-09-08 ENCOUNTER — Ambulatory Visit: Payer: 59 | Admitting: Allergy and Immunology

## 2018-09-14 ENCOUNTER — Encounter: Payer: 59 | Admitting: Internal Medicine

## 2018-09-22 ENCOUNTER — Other Ambulatory Visit: Payer: Self-pay

## 2018-09-22 ENCOUNTER — Encounter: Payer: Self-pay | Admitting: Allergy and Immunology

## 2018-09-22 ENCOUNTER — Ambulatory Visit (INDEPENDENT_AMBULATORY_CARE_PROVIDER_SITE_OTHER): Payer: 59 | Admitting: Allergy and Immunology

## 2018-09-22 VITALS — BP 130/80 | HR 80 | Temp 97.6°F | Resp 16 | Ht 69.5 in | Wt 188.6 lb

## 2018-09-22 DIAGNOSIS — T782XXD Anaphylactic shock, unspecified, subsequent encounter: Secondary | ICD-10-CM | POA: Diagnosis not present

## 2018-09-22 DIAGNOSIS — T63481D Toxic effect of venom of other arthropod, accidental (unintentional), subsequent encounter: Secondary | ICD-10-CM

## 2018-09-22 DIAGNOSIS — D8940 Mast cell activation, unspecified: Secondary | ICD-10-CM

## 2018-09-22 NOTE — Progress Notes (Signed)
[name]   Follow-up Note  Referring Provider: Lemmie Evens, MD Primary Provider: Lemmie Evens, MD Date of Office Visit: 09/22/2018  Subjective:   Douglas Nunez. (DOB: 1955-04-21) is a 63 y.o. 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  His last visit to this clinic was 19 November 2016.  While utilizing a combination of an H1 and H2 receptor blocker and aspirin on a daily basis he has not had any reactions from his mast cell activating syndrome.  He has not had to use an EpiPen.  He elected to not start immunotherapy directed against hymenoptera venom although it was suggested that he start immunotherapy during his last visit.  He continues to carry an EpiPen.  Allergies as of 09/22/2018      Reactions   Bee Venom Anaphylaxis   Trulicity [dulaglutide] Other (See Comments)   Belching, stomach cramps, diarrhea   Nsaids Itching, Rash      Medication List      amLODipine 5 MG tablet Commonly known as: NORVASC Take 5 mg every morning by mouth.   aspirin 81 MG tablet Take 81 mg by mouth daily.   EpiPen 2-Pak 0.3 mg/0.3 mL Soaj injection Generic drug: EPINEPHrine Inject 0.3 mg into the muscle once.   Jardiance 10 MG Tabs tablet Generic drug: empagliflozin Take 10 mg by mouth daily.   liraglutide 18 MG/3ML Sopn Commonly known as: VICTOZA Inject into the skin. Injected 1.2 mg daily   loratadine 10 MG tablet Commonly known as: CLARITIN Take 10 mg by mouth 2 (two) times daily.   losartan 50 MG tablet Commonly known as: COZAAR TAKE 1 TABLET BY MOUTH  DAILY       Past Medical History:  Diagnosis Date  . Allergy   . Anaphylaxis due to hymenoptera venom   . Compression fracture of thoracic vertebra (HCC) T11    seen on CT  . Degenerative joint disease (DJD) of lumbar spine   . Diabetes mellitus without complication ([place])   . Hypertension   . Mast cell activation syndrome ([place])   . Skin cancer    several places removed, and one Moths surgery on right temple  . Vasovagal syncope 2018   associated with diarrhea, hyperglycemia    Past Surgical History:  Procedure Laterality Date  . APPENDECTOMY     age 42  . CATARACT EXTRACTION Bilateral    and lens implanted  . KNEE SURGERY Left   . LASIK Bilateral   . MOHS SURGERY Right    temple  . RETINAL DETACHMENT SURGERY Right   . SKIN BIOPSY     multiple skin cancers removed    Review of systems negative except as noted in HPI / PMHx or noted below:  Review of Systems  Constitutional: Negative.   HENT: Negative.   Eyes: Negative.   Respiratory: Negative.   Cardiovascular: Negative.   Gastrointestinal: Negative.   Genitourinary: Negative.   Musculoskeletal: Negative.   Skin: Negative.   Neurological: Negative.   Endo/Heme/Allergies: Negative.   Psychiatric/Behavioral: Negative.      Objective:   Vitals:   09/22/18 1549  BP: 130/80  Pulse: 80  Resp: 16  Temp: 97.6 F (36.4 C)  SpO2: 96%   Height: 5' 9.5" (176.5 cm)  Weight: 188 lb 9.6  oz (85.5 kg)   Physical Exam Constitutional:      Appearance: He is not diaphoretic.  HENT:     Head: Normocephalic.     Right Ear: Tympanic membrane, ear canal and external ear normal.     Left Ear: Tympanic membrane, ear canal and external ear normal.     Nose: Nose normal. No mucosal edema or rhinorrhea.     Mouth/Throat:     Pharynx: Uvula midline. No oropharyngeal exudate.  Eyes:     Conjunctiva/sclera: Conjunctivae normal.  Neck:     Thyroid: No thyromegaly.     Trachea: Trachea normal. No tracheal tenderness or tracheal deviation.  Cardiovascular:     Rate and Rhythm: Normal rate and regular rhythm.     Heart sounds: Normal heart sounds, S1 normal and S2 normal. No murmur.   Pulmonary:     Effort: No respiratory distress.     Breath sounds: Normal breath sounds. No stridor. No wheezing or rales.  Lymphadenopathy:     Head:     Right side of head: No tonsillar adenopathy.     Left side of head: No tonsillar adenopathy.     Cervical: No cervical adenopathy.  Skin:    Findings: No erythema or rash.     Nails: There is no clubbing.   Neurological:     Mental Status: He is alert.     Diagnostics: none  Assessment and Plan:   1. Mast cell activation syndrome ([place])   2. Anaphylaxis due to hymenoptera venom, accidental or unintentional, subsequent encounter     1. Auvi-Q 0.3, Benadryl, M.D./ER evaluation for allergic reaction  2. Every day utilize the following medications:   A. loratadine 10 mg tablet twice a day  B. Famotidine 20  mg tablet twice a day  C. aspirin 81 mg daily  3. Consider starting immunotherapy against mixed vespid and wasp.  4. Blood - tryptase  5. Return to clinic in 12 months or earlier if problem  6. Obtain fall flu vaccine (and COVID vaccine)  Overall Marissa has really done well on his current therapy directed against mast cell activating syndrome.  He will remain on an H1 and H2 receptor blocker and aspirin at this point.  We will recheck his tryptase level.  I did recommend once again that he start immunotherapy against mixed vespid and wasp.  I will see him back in this clinic in 12 months or earlier if there is a problem.  Allena Katz, MD Allergy / Immunology [place]

## 2018-09-22 NOTE — Patient Instructions (Addendum)
  1. Auvi-Q 0.3, Benadryl, M.D./ER evaluation for allergic reaction  2. Every day utilize the following medications:   A. loratadine 10 mg tablet twice a day  B. Famotidine 20 mg tablet twice a day  C. aspirin 81 mg daily  3. Consider starting immunotherapy against mixed vespid and wasp.  4. Blood - tryptase  5. Return to clinic in 12 months or earlier if problem  6. Obtain fall flu vaccine (and COVID vaccine)

## 2018-09-23 ENCOUNTER — Encounter: Payer: Self-pay | Admitting: Allergy and Immunology

## 2018-09-24 ENCOUNTER — Telehealth: Payer: Self-pay | Admitting: Cardiovascular Disease

## 2018-09-24 LAB — TRYPTASE: Tryptase: 20.4 ug/L — ABNORMAL HIGH (ref 2.2–13.2)

## 2018-09-24 NOTE — Telephone Encounter (Signed)

## 2018-09-25 ENCOUNTER — Other Ambulatory Visit: Payer: Self-pay

## 2018-09-25 ENCOUNTER — Encounter: Payer: Self-pay | Admitting: Cardiovascular Disease

## 2018-09-25 ENCOUNTER — Ambulatory Visit (INDEPENDENT_AMBULATORY_CARE_PROVIDER_SITE_OTHER): Payer: 59 | Admitting: Cardiovascular Disease

## 2018-09-25 DIAGNOSIS — I10 Essential (primary) hypertension: Secondary | ICD-10-CM | POA: Diagnosis not present

## 2018-09-25 DIAGNOSIS — E782 Mixed hyperlipidemia: Secondary | ICD-10-CM | POA: Diagnosis not present

## 2018-09-25 DIAGNOSIS — Z8249 Family history of ischemic heart disease and other diseases of the circulatory system: Secondary | ICD-10-CM | POA: Diagnosis not present

## 2018-09-25 DIAGNOSIS — E785 Hyperlipidemia, unspecified: Secondary | ICD-10-CM | POA: Insufficient documentation

## 2018-09-25 NOTE — Patient Instructions (Addendum)
Medication Instructions:  Your physician recommends that you continue on your current medications as directed. Please refer to the Current Medication list given to you today. If you need a refill on your cardiac medications before your next appointment, please call your pharmacy.   Lab work: NONE If you have labs (blood work) drawn today and your tests are completely normal, you will receive your results only by: Marland Kitchen MyChart Message (if you have MyChart) OR . A paper copy in the mail If you have any lab test that is abnormal or we need to change your treatment, we will call you to review the results.  Testing/Procedures: OUT OF POCKET COST $150 Coronary Calcium Scan A coronary calcium scan is an imaging test used to look for deposits of calcium and other fatty materials (plaques) in the inner lining of the blood vessels of the heart (coronary arteries). These deposits of calcium and plaques can partly clog and narrow the coronary arteries without producing any symptoms or warning signs. This puts a person at risk for a heart attack. This test can detect these deposits before symptoms develop. Tell a health care provider about:  Any allergies you have.  All medicines you are taking, including vitamins, herbs, eye drops, creams, and over-the-counter medicines.  Any problems you or family members have had with anesthetic medicines.  Any blood disorders you have.  Any surgeries you have had.  Any medical conditions you have.  Whether you are pregnant or may be pregnant. What are the risks? Generally, this is a safe procedure. However, problems may occur, including:  Harm to a pregnant woman and her unborn baby. This test involves the use of radiation. Radiation exposure can be dangerous to a pregnant woman and her unborn baby. If you are pregnant, you generally should not have this procedure done.  Slight increase in the risk of cancer. This is because of the radiation involved in the  test. What happens before the procedure? No preparation is needed for this procedure. What happens during the procedure?   You will undress and remove any jewelry around your neck or chest.  You will put on a hospital gown.  Sticky electrodes will be placed on your chest. The electrodes will be connected to an electrocardiogram (ECG) machine to record a tracing of the electrical activity of your heart.  A CT scanner will take pictures of your heart. During this time, you will be asked to lie still and hold your breath for 2-3 seconds while a picture of your heart is being taken. The procedure may vary among health care providers and hospitals. What happens after the procedure?  You can get dressed.  You can return to your normal activities.  It is up to you to get the results of your test. Ask your health care provider, or the department that is doing the test, when your results will be ready. Summary  A coronary calcium scan is an imaging test used to look for deposits of calcium and other fatty materials (plaques) in the inner lining of the blood vessels of the heart (coronary arteries).  Generally, this is a safe procedure. Tell your health care provider if you are pregnant or may be pregnant.  No preparation is needed for this procedure.  A CT scanner will take pictures of your heart.  You can return to your normal activities after the scan is done. This information is not intended to replace advice given to you by your health care provider. Make  sure you discuss any questions you have with your health care provider. Document Released: 08/24/2007 Document Revised: 02/07/2017 Document Reviewed: 01/15/2016 Elsevier Patient Education  [place]: At [facility], you and your health needs are our priority.  As part of our continuing mission to provide you with exceptional heart care, we have created designated Provider Care Teams.  These Care Teams  include your primary Cardiologist (physician) and Advanced Practice Providers (APPs -  Physician Assistants and Nurse Practitioners) who all work together to provide you with the care you need, when you need it. You will need a follow up appointment in 12 months WITH DR. Gwenlyn Found.  Please call our office 2 months in advance to schedule this appointment.

## 2018-09-25 NOTE — Progress Notes (Signed)
09/25/2018 [facility].   [birth year]  [id number]  Primary Physician Lemmie Evens, MD Primary Cardiologist: Lorretta Harp MD Lupe Carney, [place]  HPI:  Douglas Nunez. is a 63 y.o. fit appearing married Caucasian male father of 2, grandfather 2 grandchildren whose husband Douglas Harte, [name] was my patient and he passed away 4 years ago.  His wife Douglas Nunez is an Advertising copywriter [employer].  [name] was referred to me by Dr. Karie Kirks for cardiovascular evaluation because of risk factors.  His cardiovascular risk factor profile is notable for treated hypertension and diabetes.  His LDL is mildly elevated.  He is never smoked.  Does have a strong family history for heart disease with both mother and father both of who had myocardial infarction's.  He is never had a heart attack or stroke.  He gets occasional atypical chest pain which does not sound ischemic and has some dyspnea on exertion probably from deconditioning.  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   Current Meds  Medication Sig  . amLODipine (NORVASC) 5 MG tablet Take 5 mg every morning by mouth.   Marland Kitchen aspirin 81 MG tablet Take 81 mg by mouth daily.  . empagliflozin (JARDIANCE) 10 MG TABS tablet Take 10 mg by mouth daily.  Marland Kitchen EPINEPHrine (EPIPEN 2-PAK) 0.3 mg/0.3 mL IJ SOAJ injection Inject 0.3 mg into the muscle once.  . liraglutide (VICTOZA) 18 MG/3ML SOPN Inject into the skin. Injected 1.2 mg daily  . loratadine (CLARITIN) 10 MG tablet Take 10 mg by mouth 2 (two) times daily.  Marland Kitchen losartan (COZAAR) 50 MG tablet TAKE 1 TABLET BY MOUTH  DAILY  . pantoprazole (PROTONIX) 40 MG tablet Take 40 mg by mouth 2 (two) times daily before a meal.      Allergies  Allergen Reactions  . Bee Venom Anaphylaxis  . Trulicity [Dulaglutide] Other (See Comments)    Belching, stomach cramps, diarrhea  . Nsaids Itching and Rash    Social History   Socioeconomic History  . Marital status:  Married    Spouse name: Not on file  . Number of children: 2  . Years of education: Not on file  . Highest education level: Not on file  Occupational History  . Occupation: Research scientist (life sciences): [employer]  Social Needs  . Financial resource strain: Not on file  . Food insecurity    Worry: Not on file    Inability: Not on file  . Transportation needs    Medical: Not on file    Non-medical: Not on file  Tobacco Use  . Smoking status: Never Smoker  . Smokeless tobacco: Never Used  Substance and Sexual Activity  . Alcohol use: Yes    Alcohol/week: 0.0 standard drinks    Comment: occ  . Drug use: No  . Sexual activity: Not on file  Lifestyle  . Physical activity    Days per week: Not on file    Minutes per session: Not on file  . Stress: Not on file  Relationships  . Social Herbalist on phone: Not on file    Gets together: Not on file    Attends religious service: Not on file    Active member of club or organization: Not on file    Attends meetings of clubs or organizations: Not on file    Relationship status:  Not on file  . Intimate partner violence    Fear of current or ex partner: Not on file    Emotionally abused: Not on file    Physically abused: Not on file    Forced sexual activity: Not on file  Other Topics Concern  . Not on file  Social History Narrative   Married to Douglas Nunez   Never smoker   Rare EtOH   No drugs     Review of Systems: General: negative for chills, fever, night sweats or weight changes.  Cardiovascular: negative for chest pain, dyspnea on exertion, edema, orthopnea, palpitations, paroxysmal nocturnal dyspnea or shortness of breath Dermatological: negative for rash Respiratory: negative for cough or wheezing Urologic: negative for hematuria Abdominal: negative for nausea, vomiting, diarrhea, bright red blood per rectum, melena, or hematemesis Neurologic: negative for visual  changes, syncope, or dizziness All other systems reviewed and are otherwise negative except as noted above.    Blood pressure (!) 154/86, pulse 73, temperature (!) 97.3 F (36.3 C), height 5' 9.5" (1.765 m), weight 189 lb 6.4 oz (85.9 kg), SpO2 96 %.  General appearance: alert and no distress Neck: no adenopathy, no carotid bruit, no JVD, supple, symmetrical, trachea midline and thyroid not enlarged, symmetric, no tenderness/mass/nodules Lungs: clear to auscultation bilaterally Heart: regular rate and rhythm, S1, S2 normal, no murmur, click, rub or gallop Extremities: extremities normal, atraumatic, no cyanosis or edema Pulses: 2+ and symmetric Skin: Skin color, texture, turgor normal. No rashes or lesions Neurologic: Alert and oriented X 3, normal strength and tone. Normal symmetric reflexes. Normal coordination and gait  EKG sinus rhythm at 71 with septal Q waves.  I personally reviewed this EKG.  ASSESSMENT AND PLAN:   Essential hypertension History of essential pretension with blood pressure measured today at 154/86 although this is unusually high for him.  He is on low-dose amlodipine and losartan.  Hyperlipidemia History of mild hyperlipidemia not on statin therapy with an LDL of 113 measured 09/15/2018.  Family history of heart disease Both mother and father both had ischemic heart disease      Lorretta Harp MD [facility], [facility] 09/25/2018 2:29 PM

## 2018-09-25 NOTE — Assessment & Plan Note (Signed)
History of essential pretension with blood pressure measured today at 154/86 although this is unusually high for him.  He is on low-dose amlodipine and losartan.

## 2018-09-25 NOTE — Assessment & Plan Note (Signed)
Both mother and father both had ischemic heart disease

## 2018-09-25 NOTE — Assessment & Plan Note (Signed)
History of mild hyperlipidemia not on statin therapy with an LDL of 113 measured 09/15/2018.

## 2018-09-30 ENCOUNTER — Telehealth: Payer: Self-pay | Admitting: *Deleted

## 2018-09-30 NOTE — Telephone Encounter (Signed)
Please inform patient that there is no treatment available to reverse his mast cell burden.  Fortunately for him, his mast cell burden, based on tryptase levels, appears to be stable and has not been progressive.  We still need to check his tryptase level every year to make sure were not accelerating regarding mast cell burden which would require a completely different approach if that was the case.

## 2018-09-30 NOTE — Telephone Encounter (Signed)
-----   Message from Jiles Prows, MD sent at 09/25/2018  2:01 PM EDT ----- Please inform patient that tryptase level is still elevated at 20.4. so still with very active mast cells.

## 2018-09-30 NOTE — Telephone Encounter (Signed)
Spoke to patient advised as written per Dr Kozlow patient verbalized understanding 

## 2018-09-30 NOTE — Telephone Encounter (Signed)
Called patient advised of lab results. Patient would like to know is there anything that will be done about this? Dr Neldon Mc please advise

## 2018-10-01 ENCOUNTER — Telehealth: Payer: Self-pay | Admitting: Internal Medicine

## 2018-10-01 NOTE — Telephone Encounter (Signed)

## 2018-10-01 NOTE — Telephone Encounter (Signed)
Pt responded "no" to all screening questions °

## 2018-10-02 ENCOUNTER — Ambulatory Visit (AMBULATORY_SURGERY_CENTER): Payer: 59 | Admitting: Internal Medicine

## 2018-10-02 ENCOUNTER — Encounter: Payer: Self-pay | Admitting: Internal Medicine

## 2018-10-02 ENCOUNTER — Other Ambulatory Visit: Payer: Self-pay

## 2018-10-02 VITALS — BP 105/69 | HR 69 | Temp 98.7°F | Resp 14 | Ht 70.0 in | Wt 185.0 lb

## 2018-10-02 DIAGNOSIS — Z1211 Encounter for screening for malignant neoplasm of colon: Secondary | ICD-10-CM

## 2018-10-02 DIAGNOSIS — D120 Benign neoplasm of cecum: Secondary | ICD-10-CM

## 2018-10-02 DIAGNOSIS — D123 Benign neoplasm of transverse colon: Secondary | ICD-10-CM

## 2018-10-02 DIAGNOSIS — D125 Benign neoplasm of sigmoid colon: Secondary | ICD-10-CM

## 2018-10-02 DIAGNOSIS — K635 Polyp of colon: Secondary | ICD-10-CM

## 2018-10-02 MED ORDER — SODIUM CHLORIDE 0.9 % IV SOLN: 500.0000 mL | Freq: Once | INTRAVENOUS | Status: DC

## 2018-10-02 NOTE — Patient Instructions (Addendum)
I found and removed 3 tiny polyps. You also have a condition called diverticulosis - common and not usually a problem. Please read the handout provided.  I will let you know pathology results and when to have another routine colonoscopy by mail and/or My Chart.  I appreciate the opportunity to care for you. Gatha Mayer, MD, FACG   YOU HAD AN ENDOSCOPIC PROCEDURE TODAY AT [facility]:   Refer to the procedure report that was given to you for any specific questions about what was found during the examination.  If the procedure report does not answer your questions, please call your gastroenterologist to clarify.  If you requested that your care partner not be given the details of your procedure findings, then the procedure report has been included in a sealed envelope for you to review at your convenience later.  YOU SHOULD EXPECT: Some feelings of bloating in the abdomen. Passage of more gas than usual.  Walking can help get rid of the air that was put into your GI tract during the procedure and reduce the bloating. If you had a lower endoscopy (such as a colonoscopy or flexible sigmoidoscopy) you may notice spotting of blood in your stool or on the toilet paper. If you underwent a bowel prep for your procedure, you may not have a normal bowel movement for a few days.  Please Note:  You might notice some irritation and congestion in your nose or some drainage.  This is from the oxygen used during your procedure.  There is no need for concern and it should clear up in a day or so.  SYMPTOMS TO REPORT IMMEDIATELY:   Following lower endoscopy (colonoscopy or flexible sigmoidoscopy):  Excessive amounts of blood in the stool  Significant tenderness or worsening of abdominal pains  Swelling of the abdomen that is new, acute  Fever of 100F or higher   For urgent or emergent issues, a gastroenterologist can be reached at any hour by calling [phone].   DIET:   We do recommend a small meal at first, but then you may proceed to your regular diet.  Drink plenty of fluids but you should avoid alcoholic beverages for 24 hours.  ACTIVITY:  You should plan to take it easy for the rest of today and you should NOT DRIVE or use heavy machinery until tomorrow (because of the sedation medicines used during the test).    FOLLOW UP: Our staff will call the number listed on your records 48-72 hours following your procedure to check on you and address any questions or concerns that you may have regarding the information given to you following your procedure. If we do not reach you, we will leave a message.  We will attempt to reach you two times.  During this call, we will ask if you have developed any symptoms of COVID 19. If you develop any symptoms (ie: fever, flu-like symptoms, shortness of breath, cough etc.) before then, please call [phone].  If you test positive for Covid 19 in the 2 weeks post procedure, please call and report this information to [place].    If any biopsies were taken you will be contacted by phone or by letter within the next 1-3 weeks.  Please call us at [phone] if you have not heard about the biopsies in 3 weeks.    SIGNATURES/CONFIDENTIALITY: You and/or your care partner have signed paperwork which will be entered into your electronic medical record.  These signatures  attest to the fact that that the information above on your After Visit Summary has been reviewed and is understood.  Full responsibility of the confidentiality of this discharge information lies with you and/or your care-partner.

## 2018-10-02 NOTE — Op Note (Signed)
[facility] Patient Name: Douglas Nunez Procedure Date: 10/02/2018 2:51 PM MRN: [id number] Endoscopist: Gatha Mayer , MD Age: 63 Referring MD:  Date of Birth: [birth year] Gender: Male Account #: [id number] Procedure:                Colonoscopy Indications:              Screening for colorectal malignant neoplasm, This                            is the patient's first colonoscopy Medicines:                Propofol per Anesthesia, Monitored Anesthesia Care Procedure:                Pre-Anesthesia Assessment:                           - Prior to the procedure, a History and Physical                            was performed, and patient medications and                            allergies were reviewed. The patient's tolerance of                            previous anesthesia was also reviewed. The risks                            and benefits of the procedure and the sedation                            options and risks were discussed with the patient.                            All questions were answered, and informed consent                            was obtained. Prior Anticoagulants: The patient has                            taken no previous anticoagulant or antiplatelet                            agents. ASA Grade Assessment: II - A patient with                            mild systemic disease. After reviewing the risks                            and benefits, the patient was deemed in                            satisfactory condition to undergo the procedure.  After obtaining informed consent, the colonoscope                            was passed under direct vision. Throughout the                            procedure, the patient's blood pressure, pulse, and                            oxygen saturations were monitored continuously. The                            Colonoscope was introduced through the anus and   advanced to the the cecum, identified by                            appendiceal orifice and ileocecal valve. The                            colonoscopy was performed without difficulty. The                            patient tolerated the procedure well. The quality                            of the bowel preparation was good. The ileocecal                            valve, appendiceal orifice, and rectum were                            photographed. The bowel preparation used was                            Miralax via split dose instruction. Scope In: 3:03:57 PM Scope Out: 3:19:38 PM Scope Withdrawal Time: 0 hours 13 minutes 31 seconds  Total Procedure Duration: 0 hours 15 minutes 41 seconds  Findings:                 The perianal and digital rectal examinations were                            normal. Pertinent negatives include normal prostate                            (size, shape, and consistency).                           Three sessile polyps were found in the sigmoid                            colon, transverse colon and cecum. The polyps were                            diminutive in size. These polyps  were removed with                            a cold snare. Resection and retrieval were                            complete. Verification of patient identification                            for the specimen was done. Estimated blood loss was                            minimal.                           Multiple small and large-mouthed diverticula were                            found in the sigmoid colon, descending colon,                            transverse colon and ascending colon.                           The exam was otherwise without abnormality on                            direct and retroflexion views. Complications:            No immediate complications. Estimated Blood Loss:     Estimated blood loss was minimal. Impression:               - Three diminutive polyps in  the sigmoid colon, in                            the transverse colon and in the cecum, removed with                            a cold snare. Resected and retrieved.                           - Diverticulosis in the sigmoid colon, in the                            descending colon, in the transverse colon and in                            the ascending colon.                           - The examination was otherwise normal on direct                            and retroflexion views. Recommendation:           - Patient has a contact number available for  emergencies. The signs and symptoms of potential                            delayed complications were discussed with the                            patient. Return to normal activities tomorrow.                            Written discharge instructions were provided to the                            patient.                           - Resume previous diet.                           - Continue present medications.                           - Repeat colonoscopy is recommended. The                            colonoscopy date will be determined after pathology                            results from today's exam become available for                            review. Gatha Mayer, MD 10/02/2018 3:26:42 PM This report has been signed electronically.

## 2018-10-02 NOTE — Progress Notes (Signed)
PT taken to PACU. Monitors in place. VSS. Report given to RN. 

## 2018-10-02 NOTE — Progress Notes (Signed)
Called to room to assist during endoscopic procedure.  Patient ID and intended procedure confirmed with present staff. Received instructions for my participation in the procedure from the performing physician.  

## 2018-10-02 NOTE — Progress Notes (Signed)
Pt's states no medical or surgical changes since previsit or office visit.  [place]

## 2018-10-06 ENCOUNTER — Telehealth: Payer: Self-pay

## 2018-10-06 NOTE — Telephone Encounter (Signed)
  Follow up Call-  Call back number 10/02/2018  Post procedure Call Back phone  # [phone]  Permission to leave phone message Yes  Some recent data might be hidden     Patient questions:  Do you have a fever, pain , or abdominal swelling? No. Pain Score  0 *  Have you tolerated food without any problems? Yes.    Have you been able to return to your normal activities? Yes.    Do you have any questions about your discharge instructions: Diet   No. Medications  No. Follow up visit  No.  Do you have questions or concerns about your Care? No.  Actions: * If pain score is 4 or above: No action needed, pain <4.  1. Have you developed a fever since your procedure? no  2.   Have you had an respiratory symptoms (SOB or cough) since your procedure? no  3.   Have you tested positive for COVID 19 since your procedure no  4.   Have you had any family members/close contacts diagnosed with the COVID 19 since your procedure?  no   If yes to any of these questions please route to Joylene John, RN and Alphonsa Gin, Therapist, sports.

## 2018-10-07 ENCOUNTER — Encounter: Payer: Self-pay | Admitting: Internal Medicine

## 2018-10-07 DIAGNOSIS — Z86010 Personal history of colonic polyps: Secondary | ICD-10-CM

## 2018-10-07 DIAGNOSIS — Z860101 Personal history of adenomatous and serrated colon polyps: Secondary | ICD-10-CM | POA: Insufficient documentation

## 2018-10-07 HISTORY — DX: Personal history of colonic polyps: Z86.010

## 2018-10-07 HISTORY — DX: Personal history of adenomatous and serrated colon polyps: Z86.0101

## 2018-10-07 NOTE — Progress Notes (Signed)
3 diminutive adenomas recall 2025 My Chart

## 2018-10-12 ENCOUNTER — Other Ambulatory Visit: Payer: Self-pay

## 2018-10-12 DIAGNOSIS — E782 Mixed hyperlipidemia: Secondary | ICD-10-CM

## 2018-10-13 ENCOUNTER — Other Ambulatory Visit: Payer: Self-pay

## 2018-10-13 MED ORDER — ROSUVASTATIN CALCIUM 5 MG PO TABS: 5.0000 mg | ORAL_TABLET | ORAL | 3 refills | Status: DC

## 2018-10-13 MED ORDER — ROSUVASTATIN CALCIUM 5 MG PO TABS: 5.0000 mg | ORAL_TABLET | ORAL | 1 refills | Status: DC

## 2018-10-27 ENCOUNTER — Other Ambulatory Visit: Payer: Self-pay

## 2018-10-27 ENCOUNTER — Ambulatory Visit (INDEPENDENT_AMBULATORY_CARE_PROVIDER_SITE_OTHER)
Admission: RE | Admit: 2018-10-27 | Discharge: 2018-10-27 | Disposition: A | Discharge status: Home / Self Care | Payer: Self-pay | Source: Ambulatory Visit | Attending: Cardiovascular Disease | Admitting: Cardiovascular Disease

## 2018-10-27 DIAGNOSIS — Z8249 Family history of ischemic heart disease and other diseases of the circulatory system: Secondary | ICD-10-CM

## 2018-10-29 ENCOUNTER — Telehealth: Payer: Self-pay | Admitting: *Deleted

## 2018-10-29 DIAGNOSIS — R931 Abnormal findings on diagnostic imaging of heart and coronary circulation: Secondary | ICD-10-CM

## 2018-10-29 DIAGNOSIS — Z8249 Family history of ischemic heart disease and other diseases of the circulatory system: Secondary | ICD-10-CM

## 2018-10-29 NOTE — Telephone Encounter (Addendum)
-----   Message from Lorretta Harp, MD sent at 10/27/2018  5:13 PM EDT ----- Coronary calcium score was 130.  Please order coronary CTA to further evaluate.  Spoke with pt, aware of results. He retired yesterday and needs the testing by the end of this month or after the first of the year. Order placed

## 2018-11-04 ENCOUNTER — Telehealth (HOSPITAL_COMMUNITY): Payer: Self-pay | Admitting: Emergency Medicine

## 2018-11-04 NOTE — Telephone Encounter (Signed)
Reaching out to patient to offer assistance regarding upcoming cardiac imaging study; pt verbalizes understanding of appt date/time, parking situation and where to check in, pre-test NPO status and medications ordered, and verified current allergies; name and call back number provided for further questions should they arise Ryna Beckstrom RN Navigator Cardiac Imaging [facility] [phone] office [phone] cell 

## 2018-11-05 ENCOUNTER — Other Ambulatory Visit: Payer: Self-pay

## 2018-11-05 ENCOUNTER — Ambulatory Visit (HOSPITAL_COMMUNITY)
Admission: RE | Admit: 2018-11-05 | Discharge: 2018-11-05 | Disposition: A | Discharge status: Home / Self Care | Payer: 59 | Source: Ambulatory Visit | Attending: Cardiovascular Disease | Admitting: Cardiovascular Disease

## 2018-11-05 DIAGNOSIS — Z8249 Family history of ischemic heart disease and other diseases of the circulatory system: Secondary | ICD-10-CM

## 2018-11-05 DIAGNOSIS — R931 Abnormal findings on diagnostic imaging of heart and coronary circulation: Secondary | ICD-10-CM | POA: Diagnosis present

## 2018-11-05 DIAGNOSIS — I2510 Atherosclerotic heart disease of native coronary artery without angina pectoris: Secondary | ICD-10-CM

## 2018-11-05 MED ORDER — IOHEXOL 350 MG/ML SOLN
80.0000 mL | Freq: Once | INTRAVENOUS | Status: AC | PRN
  Administered 2018-11-05: 11:00:00 80 mL via INTRAVENOUS

## 2018-11-05 MED ORDER — NITROGLYCERIN 0.4 MG SL SUBL
0.8000 mg | SUBLINGUAL_TABLET | Freq: Once | SUBLINGUAL | Status: AC
  Administered 2018-11-05: 0.8 mg via SUBLINGUAL
  Filled 2018-11-05: qty 25

## 2018-11-05 MED ORDER — METOPROLOL TARTRATE 5 MG/5ML IV SOLN
5.0000 mg | INTRAVENOUS | Status: DC | PRN
  Administered 2018-11-05: 5 mg via INTRAVENOUS
  Filled 2018-11-05: qty 5

## 2018-11-05 MED ORDER — METOPROLOL TARTRATE 5 MG/5ML IV SOLN
INTRAVENOUS | Status: AC
  Filled 2018-11-05: qty 20

## 2018-11-05 MED ORDER — NITROGLYCERIN 0.4 MG SL SUBL
SUBLINGUAL_TABLET | SUBLINGUAL | Status: AC
  Filled 2018-11-05: qty 2

## 2018-11-05 NOTE — Progress Notes (Signed)
Ct complete. Patient denies any complaints. Offered patient snack and beverage.

## 2018-11-09 DIAGNOSIS — I2510 Atherosclerotic heart disease of native coronary artery without angina pectoris: Secondary | ICD-10-CM

## 2018-11-13 ENCOUNTER — Encounter: Payer: Self-pay | Admitting: Cardiovascular Disease

## 2018-11-17 ENCOUNTER — Encounter: Payer: Self-pay | Admitting: Cardiovascular Disease

## 2018-11-17 ENCOUNTER — Other Ambulatory Visit: Payer: Self-pay

## 2018-11-17 ENCOUNTER — Telehealth: Payer: Self-pay

## 2018-11-17 ENCOUNTER — Ambulatory Visit (INDEPENDENT_AMBULATORY_CARE_PROVIDER_SITE_OTHER): Payer: 59 | Admitting: Cardiovascular Disease

## 2018-11-17 VITALS — BP 142/87 | HR 76 | Ht 69.5 in | Wt 187.4 lb

## 2018-11-17 DIAGNOSIS — E782 Mixed hyperlipidemia: Secondary | ICD-10-CM

## 2018-11-17 MED ORDER — ATORVASTATIN CALCIUM 40 MG PO TABS: 40.0000 mg | ORAL_TABLET | Freq: Every day | ORAL | 3 refills | Status: DC

## 2018-11-17 MED ORDER — COENZYME Q10 200 MG PO CAPS: 200.0000 mg | ORAL_CAPSULE | Freq: Every day | ORAL | 3 refills | Status: DC

## 2018-11-17 NOTE — Telephone Encounter (Signed)
Pt aware to start COQ10 200 mg by mouth daily and that Rx has been sent [facility]. Pt verbalized understanding

## 2018-11-17 NOTE — Progress Notes (Signed)
Douglas Nunez returns today to discuss his coronary CTA/FFR.  He is accompanied by his wife Douglas Nunez.  I last saw him in the office 09/25/2018.  He was having some infrequent atypical chest pain.  He recently retired on 10/23/2018 before which she was working 80-hour weeks.  His CTA FFR showed a coronary calcium score of 130 with what appears to be significant disease in the mid and distal portion of D1.  The remainder of his coronary to me did not show significant disease.  His most recent lipid profile performed 09/15/2018 revealed total cholesterol 163, LDL of 113 on atorvastatin 10 mg a day which have been increased to 40 mg a day.  We will recheck a lipid liver polyp profile in 3 months.  I will have him see an APP back in 6 months and me back in 1 year.  Lorretta Harp, M.D., [place], [facility], [place] [address] [place], [place]  [address]  [phone] 11/17/2018 8:31 AM

## 2018-11-17 NOTE — Patient Instructions (Signed)
Medication Instructions:  Your physician has recommended you make the following change in your medication:  STOP TAKING ROSUVASTATIN (CRESTOR).  INCREASE YOUR ATORVASTATIN (LIPITOR) FROM 10 MG TO 40 MG BY MOUTH DAILY  If you need a refill on your cardiac medications before your next appointment, please call your pharmacy.   Lab work: Your physician recommends that you return for lab work in 3 MONTHS: [place] If you have labs (blood work) drawn today and your tests are completely normal, you will receive your results only by: Marland Kitchen MyChart Message (if you have MyChart) OR . A paper copy in the mail If you have any lab test that is abnormal or we need to change your treatment, we will call you to review the results.  Testing/Procedures: NONE  Follow-Up: At [facility], you and your health needs are our priority.  As part of our continuing mission to provide you with exceptional heart care, we have created designated Provider Care Teams.  These Care Teams include your primary Cardiologist (physician) and Advanced Practice Providers (APPs -  Physician Assistants and Nurse Practitioners) who all work together to provide you with the care you need, when you need it. . You will need a follow up appointment in 6 months WITH AN APP AND IN 12 MONTHS with Dr. Quay Burow.  Please call our office 2 months in advance to schedule this/each appointment.  You may see one of the following Advanced Practice Providers on your designated Care Team:   . Kerin Ransom, PA-C . Daleen Snook Kroeger, PA-C . Sande Rives, PA-C . Almyra Deforest, PA-C . Fabian Sharp, PA-C . Jory Sims, DNP . Rosaria Ferries, PA-C

## 2019-04-19 MED FILL — VICTOZA 18 MG/3 ML INJECT P: 18 | 30 days supply | Qty: 9 | Fill #0

## 2019-05-04 MED FILL — UNIFINE PENTIPS 32GX5/32": 32G X 4 MM | 30 days supply | Qty: 100 | Fill #0

## 2019-05-04 MED FILL — UNIFINE PENTIPS 32GX5/32: 32G X 4 MM | 30 days supply | Qty: 100 | Fill #0

## 2019-05-04 MED FILL — JARDIANCE 25 MG TABLET: 25 | 30 days supply | Qty: 30 | Fill #0

## 2019-05-04 MED FILL — PANTOPRAZOLE SOD DR 40 MG T: 40 | 30 days supply | Qty: 60 | Fill #0

## 2019-05-21 MED FILL — VICTOZA 18 MG/3 ML INJECT P: 18 | 30 days supply | Qty: 9 | Fill #1

## 2019-06-01 MED FILL — PANTOPRAZOLE SOD DR 40 MG T: 40 | 30 days supply | Qty: 60 | Fill #1

## 2019-07-01 MED FILL — JARDIANCE 25 MG TABLET: 25 | 30 days supply | Qty: 30 | Fill #1

## 2019-07-01 MED FILL — PANTOPRAZOLE SOD DR 40 MG T: 40 | 30 days supply | Qty: 60 | Fill #2

## 2019-07-01 MED FILL — VICTOZA 18 MG/3 ML INJECT P: 18 | 30 days supply | Qty: 9 | Fill #2

## 2019-07-01 MED FILL — ATORVASTATIN 40 MG TABLET: 40 | 30 days supply | Qty: 30 | Fill #0

## 2019-07-10 MED FILL — EPINEPHRINE 0.3 MG AUTO-INJ: 0.3 | 2 days supply | Qty: 2 | Fill #0

## 2019-07-29 MED FILL — JARDIANCE 25 MG TABLET: 25 | 30 days supply | Qty: 30 | Fill #2

## 2019-08-10 MED FILL — PANTOPRAZOLE SOD DR 40 MG T: 40 | 30 days supply | Qty: 60 | Fill #3

## 2019-08-31 MED FILL — JARDIANCE 25 MG TABLET: 25 | 30 days supply | Qty: 30 | Fill #3

## 2019-09-07 ENCOUNTER — Other Ambulatory Visit: Payer: Self-pay

## 2019-09-07 ENCOUNTER — Other Ambulatory Visit (HOSPITAL_COMMUNITY): Payer: Self-pay | Admitting: Family Medicine

## 2019-09-07 ENCOUNTER — Ambulatory Visit (HOSPITAL_COMMUNITY)
Admission: RE | Admit: 2019-09-07 | Discharge: 2019-09-07 | Disposition: A | Discharge status: Home / Self Care | Payer: 59 | Source: Ambulatory Visit | Attending: Family Medicine | Admitting: Family Medicine

## 2019-09-07 DIAGNOSIS — S299XXA Unspecified injury of thorax, initial encounter: Secondary | ICD-10-CM | POA: Diagnosis present

## 2019-09-07 DIAGNOSIS — W19XXXA Unspecified fall, initial encounter: Secondary | ICD-10-CM | POA: Insufficient documentation

## 2019-09-09 MED FILL — PANTOPRAZOLE SOD DR 40 MG T: 40 | 30 days supply | Qty: 60 | Fill #4

## 2019-09-20 MED FILL — ATORVASTATIN CALCIUM 40 MG: 40 | 30 days supply | Qty: 30 | Fill #1

## 2019-10-04 MED FILL — JARDIANCE 25 MG TABLET: 25 | 30 days supply | Qty: 30 | Fill #4

## 2019-10-12 MED FILL — PANTOPRAZOLE SOD DR 40 MG T: 40 | 30 days supply | Qty: 60 | Fill #5

## 2019-11-01 MED FILL — ATORVASTATIN CALCIUM 40 MG: 40 | 30 days supply | Qty: 30 | Fill #2

## 2019-11-02 MED FILL — JARDIANCE 25 MG TABLET: 25 | 30 days supply | Qty: 30 | Fill #5

## 2019-11-09 MED FILL — EPINEPHRINE 0.3 MG AUTO-INJ: 0.3 | 30 days supply | Qty: 2 | Fill #0

## 2019-11-09 MED FILL — PANTOPRAZOLE SOD DR 40 MG T: 40 | 30 days supply | Qty: 60 | Fill #0

## 2019-11-09 MED FILL — ONE TOUCH VERIO TEST STRIP: 17 days supply | Qty: 50 | Fill #0

## 2019-12-07 ENCOUNTER — Other Ambulatory Visit: Payer: Self-pay

## 2019-12-10 ENCOUNTER — Encounter: Payer: Self-pay | Admitting: Cardiovascular Disease

## 2019-12-10 ENCOUNTER — Ambulatory Visit (INDEPENDENT_AMBULATORY_CARE_PROVIDER_SITE_OTHER): Payer: Medicare Other | Admitting: Cardiovascular Disease

## 2019-12-10 ENCOUNTER — Other Ambulatory Visit: Payer: Self-pay

## 2019-12-10 ENCOUNTER — Telehealth: Payer: Self-pay | Admitting: Pharmacist Clinician (PhC)/ Clinical Pharmacy Specialist

## 2019-12-10 DIAGNOSIS — I2510 Atherosclerotic heart disease of native coronary artery without angina pectoris: Secondary | ICD-10-CM

## 2019-12-10 DIAGNOSIS — E782 Mixed hyperlipidemia: Secondary | ICD-10-CM

## 2019-12-10 DIAGNOSIS — I10 Essential (primary) hypertension: Secondary | ICD-10-CM | POA: Diagnosis not present

## 2019-12-10 LAB — LIPID PANEL
Chol/HDL Ratio: 4 ratio (ref 0.0–5.0)
Cholesterol, Total: 132 mg/dL (ref 100–199)
HDL: 33 mg/dL — ABNORMAL LOW (ref >39)
LDL Chol Calc (NIH): 79 mg/dL (ref 0–99)
Triglycerides: 106 mg/dL (ref 0–149)
VLDL Cholesterol Cal: 20 mg/dL (ref 5–40)

## 2019-12-10 LAB — HEPATIC FUNCTION PANEL
ALT: 26 IU/L (ref 0–44)
AST: 23 IU/L (ref 0–40)
Albumin: 4.7 g/dL (ref 3.8–4.8)
Alkaline Phosphatase: 86 IU/L (ref 44–121)
Bilirubin Total: 0.9 mg/dL (ref 0.0–1.2)
Bilirubin, Direct: 0.23 mg/dL (ref 0.00–0.40)
Total Protein: 7.5 g/dL (ref 6.0–8.5)

## 2019-12-10 NOTE — Telephone Encounter (Signed)
Spoke with patient this am - had appt with Dr. Gwenlyn Found.  Pt unable to tolerate atorvastatin due to increased myalgias.  He is going to check with PCP, as he believes he took rosuvastatin prior to this, but is unsure.  He will call back later today or early next week with that information.  ASCVD - coronary calcium score of 130 with significant disease in mid and distal portions of D1  Labs to be drawn today in office.  Insurance is thru [employer] Part D plan:  ID # [id number]  BIN   [id number]  PCN  [id number]  GRP  [id number]  If has previously failed rosuvastatin, and today's labs show LDL > 70 will start process for Praluent 150 mg (plan preferred medication).  If < 70 will need to recheck in 6-8 weeks, now that atorvastatin has been discontinued.    If he has not taken another statin in the past, will have him start with rosuvastatin 5 mg three times weekly.  Repeat labs after 6-8 weeks then go from there.

## 2019-12-10 NOTE — Assessment & Plan Note (Signed)
History of mild CAD with coronary calcium score of 130 and disease in the mid and distal portion of the first diagonal branch.  He no longer has chest pain.  My plan was medical therapy.

## 2019-12-10 NOTE — Assessment & Plan Note (Signed)
History of essential hypertension blood pressure measured today 134/88.  He is on amlodipine.

## 2019-12-10 NOTE — Progress Notes (Signed)
12/10/2019 Douglas Nunez.   [birth year]  [id number]  Primary Physician Douglas Evens, MD Primary Cardiologist: Douglas Harp MD Douglas Nunez, [place]  HPI:  [facility] Douglas Nunez. is a 64 y.o. fit appearing married Caucasian male father of 2, grandfather 2 grandchildren whose husband Douglas [place], [name] was my patient and he passed away 5years ago.  His wife Douglas Nunez is an Advertising copywriter Douglas Nunez.  Douglas Nunez was referred to me by Dr. Karie Nunez for cardiovascular evaluation because of risk factors.  I last saw him in the office 11/17/2018. His cardiovascular risk factor profile is notable for treated hypertension and diabetes.  His LDL is mildly elevated.  He is never smoked.  Does have a strong family history for heart disease with both mother and father both of who had myocardial infarction's.  He is never had a heart attack or stroke.  He gets occasional atypical chest pain which does not sound ischemic and has some dyspnea on exertion probably from deconditioning.  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  He is currently enjoying his retirement.  He and his wife bought a cabin up in the [place] of [place].  Since I saw him a year ago he is done well.  He did have a coronary CTA showing calcium score of 130 with disease in the first diagonal branch which I thought could be managed medically.  2D echocardiogram performed 06/06/2016 was normal.  He denies chest pain or shortness of breath.  I did increase his atorvastatin to 40 mg a day.  He complains of arthralgias I suspect he is statin intolerant.  He would be a good candidate for Repatha.   Current Meds  Medication Sig  . amLODipine (NORVASC) 5 MG tablet Take 5 mg every morning by mouth.   Marland Kitchen aspirin 81 MG tablet Take 81 mg by mouth daily.  Marland Kitchen atorvastatin (LIPITOR) 40 MG tablet Take 1 tablet (40 mg total) by mouth daily.  . empagliflozin (JARDIANCE) 25 MG TABS tablet Take 25 mg by mouth  daily.   Marland Kitchen EPINEPHrine (EPIPEN 2-PAK) 0.3 mg/0.3 mL IJ SOAJ injection Inject 0.3 mg into the muscle once.  . loratadine (CLARITIN) 10 MG tablet Take 10 mg by mouth 2 (two) times daily.  Douglas Nunez VERIO test strip 3 (three) times daily.  . pantoprazole (PROTONIX) 40 MG tablet Take 40 mg by mouth 2 (two) times daily before a meal.      Allergies  Allergen Reactions  . Bee Venom Anaphylaxis  . Trulicity [Dulaglutide] Other (See Comments)    Belching, stomach cramps, diarrhea  . Nsaids Itching and Rash    Social History   Socioeconomic History  . Marital status: Married    Spouse name: Not on file  . Number of children: 2  . Years of education: Not on file  . Highest education level: Not on file  Occupational History  . Occupation: Research scientist (life sciences): [employer]    Comment: Architect  Tobacco Use  . Smoking status: Never Smoker  . Smokeless tobacco: Never Used  Vaping Use  . Vaping Use: Never used  Substance and Sexual Activity  . Alcohol use: Yes    Alcohol/week: 0.0 standard drinks    Comment: occ  . Drug use: No  . Sexual activity: Not on file  Other Topics Concern  . Not on file  Social History Narrative   Married  to Douglas Nunez   Never smoker   Rare EtOH   No drugs   Social Determinants of Health   Financial Resource Strain:   . Difficulty of Paying Living Expenses: Not on file  Food Insecurity:   . Worried About Charity fundraiser in the Last Year: Not on file  . Ran Out of Food in the Last Year: Not on file  Transportation Needs:   . Lack of Transportation (Medical): Not on file  . Lack of Transportation (Non-Medical): Not on file  Physical Activity:   . Days of Exercise per Week: Not on file  . Minutes of Exercise per Session: Not on file  Stress:   . Feeling of Stress : Not on file  Social Connections:   . Frequency of Communication with Friends and Family: Not on file  . Frequency of Social Gatherings with Friends and  Family: Not on file  . Attends Religious Services: Not on file  . Active Member of Clubs or Organizations: Not on file  . Attends Archivist Meetings: Not on file  . Marital Status: Not on file  Intimate Partner Violence:   . Fear of Current or Ex-Partner: Not on file  . Emotionally Abused: Not on file  . Physically Abused: Not on file  . Sexually Abused: Not on file     Review of Systems: General: negative for chills, fever, night sweats or weight changes.  Cardiovascular: negative for chest pain, dyspnea on exertion, edema, orthopnea, palpitations, paroxysmal nocturnal dyspnea or shortness of breath Dermatological: negative for rash Respiratory: negative for cough or wheezing Urologic: negative for hematuria Abdominal: negative for nausea, vomiting, diarrhea, bright red blood per rectum, melena, or hematemesis Neurologic: negative for visual changes, syncope, or dizziness All other systems reviewed and are otherwise negative except as noted above.    Blood pressure 134/88, pulse 64, height 5\' 10"  (1.778 m), weight 186 lb 12.8 oz (84.7 kg), SpO2 97 %.  General appearance: alert and no distress Neck: no adenopathy, no carotid bruit, no JVD, supple, symmetrical, trachea midline and thyroid not enlarged, symmetric, no tenderness/mass/nodules Lungs: clear to auscultation bilaterally Heart: regular rate and rhythm, S1, S2 normal, no murmur, click, rub or gallop Extremities: extremities normal, atraumatic, no cyanosis or edema Pulses: 2+ and symmetric Skin: Skin color, texture, turgor normal. No rashes or lesions Neurologic: Alert and oriented X 3, normal strength and tone. Normal symmetric reflexes. Normal coordination and gait  EKG sinus rhythm at 64 without ST or T wave changes.  I personally reviewed this EKG.  ASSESSMENT AND PLAN:   Essential hypertension History of essential hypertension blood pressure measured today 134/88.  He is on  amlodipine.  Hyperlipidemia History of hyperlipidemia on atorvastatin although he does complain of some arthralgias and myalgias.  He would be a good candidate for Repatha.  I am going to get a lipid liver profile on him this morning.  Coronary artery disease History of mild CAD with coronary calcium score of 130 and disease in the mid and distal portion of the first diagonal branch.  He no longer has chest pain.  My plan was medical therapy.      Douglas Harp MD FACP,FACC,FAHA, [facility] 12/10/2019 8:17 AM

## 2019-12-10 NOTE — Patient Instructions (Addendum)
Medication Instructions:  Your physician recommends that you continue on your current medications as directed. Please refer to the Current Medication list given to you today.  *If you need a refill on your cardiac medications before your next appointment, please call your pharmacy*   Lab Work: Your physician recommends that you have a FASTING lipid profile/lft today.   Your physician recommends that you return for a FASTING lipid profile: 2 months after starting repatha, any time from 8-5   If you have labs (blood work) drawn today and your tests are completely normal, you will receive your results only by: Marland Kitchen MyChart Message (if you have MyChart) OR . A paper copy in the mail If you have any lab test that is abnormal or we need to change your treatment, we will call you to review the results.   Testing/Procedures: -None   Follow-Up: At [facility], you and your health needs are our priority.  As part of our continuing mission to provide you with exceptional heart care, we have created designated Provider Care Teams.  These Care Teams include your primary Cardiologist (physician) and Advanced Practice Providers (APPs -  Physician Assistants and Nurse Practitioners) who all work together to provide you with the care you need, when you need it.  We recommend signing up for the patient portal called "MyChart".  Sign up information is provided on this After Visit Summary.  MyChart is used to connect with patients for Virtual Visits (Telemedicine).  Patients are able to view lab/test results, encounter notes, upcoming appointments, etc.  Non-urgent messages can be sent to your provider as well.   To learn more about what you can do with MyChart, go to [email or web address].    Your next appointment:   1 year(s)  The format for your next appointment:   In Person  Provider:   Quay Burow, MD   Other Instructions Your physician wants you to follow-up in: 1 year with Dr. Gwenlyn Found.   You will receive a reminder letter in the mail two months in advance. If you don't receive a letter, please call our office to schedule the follow-up appointment.

## 2019-12-10 NOTE — Assessment & Plan Note (Signed)
History of hyperlipidemia on atorvastatin although he does complain of some arthralgias and myalgias.  He would be a good candidate for Repatha.  I am going to get a lipid liver profile on him this morning.

## 2019-12-14 MED ORDER — ROSUVASTATIN CALCIUM 5 MG PO TABS: ORAL_TABLET | ORAL | 3 refills | Status: DC

## 2019-12-14 NOTE — Telephone Encounter (Signed)
LMOM for patient to call back.

## 2019-12-14 NOTE — Telephone Encounter (Signed)
Patient called PCP last Friday, they checked their records back to 2012 and have no indication of any other statin drugs going back to 2012.    Will have him start rosuvastatin 5 mg three times weekly and if tolerated, repeat labs in 2 months.  Patient agreeable to plan, will call sooner if develops any myalgias.

## 2020-03-08 ENCOUNTER — Other Ambulatory Visit: Payer: Self-pay | Admitting: Cardiovascular Disease

## 2020-03-09 LAB — HEPATIC FUNCTION PANEL
ALT: 22 IU/L (ref 0–44)
AST: 17 IU/L (ref 0–40)
Albumin: 4.6 g/dL (ref 3.8–4.8)
Alkaline Phosphatase: 81 IU/L (ref 44–121)
Bilirubin Total: 0.7 mg/dL (ref 0.0–1.2)
Bilirubin, Direct: 0.15 mg/dL (ref 0.00–0.40)
Total Protein: 7.3 g/dL (ref 6.0–8.5)

## 2020-03-09 LAB — LIPID PANEL W/O CHOL/HDL RATIO
Cholesterol, Total: 167 mg/dL (ref 100–199)
HDL: 35 mg/dL — ABNORMAL LOW (ref >39)
LDL Chol Calc (NIH): 114 mg/dL — ABNORMAL HIGH (ref 0–99)
Triglycerides: 94 mg/dL (ref 0–149)
VLDL Cholesterol Cal: 18 mg/dL (ref 5–40)

## 2020-03-22 ENCOUNTER — Other Ambulatory Visit: Payer: Self-pay

## 2020-03-22 DIAGNOSIS — R931 Abnormal findings on diagnostic imaging of heart and coronary circulation: Secondary | ICD-10-CM

## 2020-03-22 DIAGNOSIS — E782 Mixed hyperlipidemia: Secondary | ICD-10-CM

## 2020-03-24 ENCOUNTER — Telehealth: Payer: Self-pay | Admitting: Pharmacist Clinician (PhC)/ Clinical Pharmacy Specialist

## 2020-03-24 NOTE — Telephone Encounter (Signed)
Spoke with patient.  We had reviewed option for PCSK-9 back in October when he was seeing Dr. Gwenlyn Found.   At that time he had only tried/failed one statin drug.  He was started on rosuvastatin 5 mg three times weekly.  Took thru December, but did note myalgias developed in mid-December.  Stopped at end of month (after labs drawn) and notes joints are feeling better now.  Will submit paperwork to see if we can get approval for PCSK-9 inhibitor.  Patient has PDP thru [employer], will need to contact pharmacy for information.

## 2020-03-29 ENCOUNTER — Telehealth: Payer: Self-pay

## 2020-03-29 DIAGNOSIS — E782 Mixed hyperlipidemia: Secondary | ICD-10-CM

## 2020-03-29 MED ORDER — PRALUENT 150 MG/ML ~~LOC~~ SOAJ: 150.0000 mg | SUBCUTANEOUS | 11 refills | Status: DC

## 2020-03-29 NOTE — Telephone Encounter (Signed)
Called and spoke w/pt regarding the approval of praluent 150 and rx sent to pharmacy on file. Pt instructed to come for labs after 4th dose fasting and to call back if the medication is unaffordable

## 2020-03-29 NOTE — Telephone Encounter (Signed)
Called and spoke w/pt regarding denial from [place] as they make to much money to qualify. Pt voiced understanding

## 2020-04-11 ENCOUNTER — Ambulatory Visit: Payer: Medicare Other

## 2020-05-19 ENCOUNTER — Other Ambulatory Visit: Payer: Self-pay | Admitting: *Deleted

## 2020-05-19 DIAGNOSIS — E782 Mixed hyperlipidemia: Secondary | ICD-10-CM

## 2020-05-20 LAB — LIPID PANEL
Chol/HDL Ratio: 2.7 ratio (ref 0.0–5.0)
Cholesterol, Total: 102 mg/dL (ref 100–199)
HDL: 38 mg/dL — ABNORMAL LOW (ref >39)
LDL Chol Calc (NIH): 46 mg/dL (ref 0–99)
Triglycerides: 90 mg/dL (ref 0–149)
VLDL Cholesterol Cal: 18 mg/dL (ref 5–40)

## 2020-05-20 LAB — HEPATIC FUNCTION PANEL
ALT: 20 IU/L (ref 0–44)
AST: 14 IU/L (ref 0–40)
Albumin: 4.7 g/dL (ref 3.8–4.8)
Alkaline Phosphatase: 91 IU/L (ref 44–121)
Bilirubin Total: 0.8 mg/dL (ref 0.0–1.2)
Bilirubin, Direct: 0.21 mg/dL (ref 0.00–0.40)
Total Protein: 8 g/dL (ref 6.0–8.5)

## 2020-08-08 ENCOUNTER — Other Ambulatory Visit: Payer: Self-pay

## 2020-08-08 ENCOUNTER — Observation Stay (HOSPITAL_COMMUNITY)
Admission: EM | Admit: 2020-08-08 | Discharge: 2020-08-09 | Disposition: A | Discharge status: Home / Self Care | Payer: Medicare Other | Attending: Internal Medicine | Admitting: Internal Medicine

## 2020-08-08 ENCOUNTER — Encounter (HOSPITAL_COMMUNITY): Payer: Self-pay | Admitting: *Deleted

## 2020-08-08 ENCOUNTER — Emergency Department (HOSPITAL_COMMUNITY): Payer: Medicare Other

## 2020-08-08 ENCOUNTER — Observation Stay (HOSPITAL_COMMUNITY): Payer: Medicare Other

## 2020-08-08 DIAGNOSIS — R55 Syncope and collapse: Secondary | ICD-10-CM | POA: Diagnosis present

## 2020-08-08 DIAGNOSIS — Z85828 Personal history of other malignant neoplasm of skin: Secondary | ICD-10-CM | POA: Insufficient documentation

## 2020-08-08 DIAGNOSIS — Z794 Long term (current) use of insulin: Secondary | ICD-10-CM | POA: Insufficient documentation

## 2020-08-08 DIAGNOSIS — R404 Transient alteration of awareness: Secondary | ICD-10-CM | POA: Diagnosis present

## 2020-08-08 DIAGNOSIS — I2510 Atherosclerotic heart disease of native coronary artery without angina pectoris: Secondary | ICD-10-CM | POA: Diagnosis not present

## 2020-08-08 DIAGNOSIS — Z79899 Other long term (current) drug therapy: Secondary | ICD-10-CM | POA: Diagnosis not present

## 2020-08-08 DIAGNOSIS — I10 Essential (primary) hypertension: Secondary | ICD-10-CM | POA: Insufficient documentation

## 2020-08-08 DIAGNOSIS — Z20822 Contact with and (suspected) exposure to covid-19: Secondary | ICD-10-CM | POA: Insufficient documentation

## 2020-08-08 DIAGNOSIS — R7989 Other specified abnormal findings of blood chemistry: Secondary | ICD-10-CM

## 2020-08-08 DIAGNOSIS — R778 Other specified abnormalities of plasma proteins: Secondary | ICD-10-CM | POA: Diagnosis not present

## 2020-08-08 DIAGNOSIS — I152 Hypertension secondary to endocrine disorders: Secondary | ICD-10-CM | POA: Diagnosis present

## 2020-08-08 DIAGNOSIS — Z7982 Long term (current) use of aspirin: Secondary | ICD-10-CM | POA: Diagnosis not present

## 2020-08-08 DIAGNOSIS — R4189 Other symptoms and signs involving cognitive functions and awareness: Secondary | ICD-10-CM

## 2020-08-08 DIAGNOSIS — E119 Type 2 diabetes mellitus without complications: Secondary | ICD-10-CM | POA: Insufficient documentation

## 2020-08-08 LAB — CBC WITH DIFFERENTIAL/PLATELET
Abs Immature Granulocytes: 0.05 10*3/uL (ref 0.00–0.07)
Basophils Absolute: 0 10*3/uL (ref 0.0–0.1)
Basophils Relative: 0 %
Eosinophils Absolute: 0 10*3/uL (ref 0.0–0.5)
Eosinophils Relative: 0 %
HCT: 49.1 % (ref 39.0–52.0)
Hemoglobin: 16.4 g/dL (ref 13.0–17.0)
Immature Granulocytes: 1 %
Lymphocytes Relative: 11 %
Lymphs Abs: 0.9 10*3/uL (ref 0.7–4.0)
MCH: 31.4 pg (ref 26.0–34.0)
MCHC: 33.4 g/dL (ref 30.0–36.0)
MCV: 93.9 fL (ref 80.0–100.0)
Monocytes Absolute: 0.1 10*3/uL (ref 0.1–1.0)
Monocytes Relative: 1 %
Neutro Abs: 7.3 10*3/uL (ref 1.7–7.7)
Neutrophils Relative %: 87 %
Platelets: 202 10*3/uL (ref 150–400)
RBC: 5.23 MIL/uL (ref 4.22–5.81)
RDW: 12.4 % (ref 11.5–15.5)
WBC: 8.4 10*3/uL (ref 4.0–10.5)
nRBC: 0 % (ref 0.0–0.2)

## 2020-08-08 LAB — TSH: TSH: 3.715 u[IU]/mL (ref 0.350–4.500)

## 2020-08-08 LAB — COMPREHENSIVE METABOLIC PANEL
ALT: 18 U/L (ref 0–44)
AST: 18 U/L (ref 15–41)
Albumin: 4 g/dL (ref 3.5–5.0)
Alkaline Phosphatase: 64 U/L (ref 38–126)
Anion gap: 4 — ABNORMAL LOW (ref 5–15)
BUN: 20 mg/dL (ref 8–23)
CO2: 28 mmol/L (ref 22–32)
Calcium: 8.4 mg/dL — ABNORMAL LOW (ref 8.9–10.3)
Chloride: 106 mmol/L (ref 98–111)
Creatinine, Ser: 1.1 mg/dL (ref 0.61–1.24)
GFR, Estimated: >60 mL/min (ref >60)
Glucose, Bld: 190 mg/dL — ABNORMAL HIGH (ref 70–99)
Potassium: 3.6 mmol/L (ref 3.5–5.1)
Sodium: 138 mmol/L (ref 135–145)
Total Bilirubin: 1 mg/dL (ref 0.3–1.2)
Total Protein: 7.2 g/dL (ref 6.5–8.1)

## 2020-08-08 LAB — TROPONIN I (HIGH SENSITIVITY)
Troponin I (High Sensitivity): 28 ng/L — ABNORMAL HIGH (ref <18)
Troponin I (High Sensitivity): 77 ng/L — ABNORMAL HIGH (ref <18)
Troponin I (High Sensitivity): 100 ng/L (ref <18)

## 2020-08-08 LAB — LACTIC ACID, PLASMA
Lactic Acid, Venous: 2.2 mmol/L (ref 0.5–1.9)
Lactic Acid, Venous: 1.1 mmol/L (ref 0.5–1.9)

## 2020-08-08 LAB — GLUCOSE, CAPILLARY: Glucose-Capillary: 186 mg/dL — ABNORMAL HIGH (ref 70–99)

## 2020-08-08 LAB — MAGNESIUM: Magnesium: 1.9 mg/dL (ref 1.7–2.4)

## 2020-08-08 LAB — CBG MONITORING, ED: Glucose-Capillary: 214 mg/dL — ABNORMAL HIGH (ref 70–99)

## 2020-08-08 MED ORDER — ACETAMINOPHEN 650 MG RE SUPP: 650.0000 mg | Freq: Four times a day (QID) | RECTAL | Status: DC | PRN

## 2020-08-08 MED ORDER — ACETAMINOPHEN 325 MG PO TABS: 650.0000 mg | ORAL_TABLET | Freq: Four times a day (QID) | ORAL | Status: DC | PRN

## 2020-08-08 MED ORDER — POLYETHYLENE GLYCOL 3350 17 G PO PACK: 17.0000 g | PACK | Freq: Every day | ORAL | Status: DC | PRN

## 2020-08-08 MED ORDER — LORATADINE 10 MG PO TABS
10.0000 mg | ORAL_TABLET | Freq: Two times a day (BID) | ORAL | Status: DC
  Administered 2020-08-08 – 2020-08-09 (×2): 10 mg via ORAL
  Filled 2020-08-08 (×2): qty 1

## 2020-08-08 MED ORDER — ENOXAPARIN SODIUM 40 MG/0.4ML IJ SOSY
40.0000 mg | PREFILLED_SYRINGE | INTRAMUSCULAR | Status: DC
  Administered 2020-08-08: 40 mg via SUBCUTANEOUS
  Filled 2020-08-08: qty 0.4

## 2020-08-08 MED ORDER — SODIUM CHLORIDE 0.9 % IV BOLUS
1000.0000 mL | Freq: Once | INTRAVENOUS | Status: AC
  Administered 2020-08-08: 1000 mL via INTRAVENOUS

## 2020-08-08 MED ORDER — PANTOPRAZOLE SODIUM 40 MG PO TBEC
40.0000 mg | DELAYED_RELEASE_TABLET | Freq: Two times a day (BID) | ORAL | Status: DC
  Administered 2020-08-08 – 2020-08-09 (×2): 40 mg via ORAL
  Filled 2020-08-08: qty 1

## 2020-08-08 MED ORDER — POTASSIUM CHLORIDE CRYS ER 20 MEQ PO TBCR
40.0000 meq | EXTENDED_RELEASE_TABLET | Freq: Once | ORAL | Status: AC
  Administered 2020-08-08: 40 meq via ORAL
  Filled 2020-08-08: qty 2

## 2020-08-08 MED ORDER — LOSARTAN POTASSIUM 50 MG PO TABS
25.0000 mg | ORAL_TABLET | Freq: Every day | ORAL | Status: DC
  Administered 2020-08-09: 25 mg via ORAL
  Filled 2020-08-08: qty 1

## 2020-08-08 MED ORDER — EMPAGLIFLOZIN 25 MG PO TABS
25.0000 mg | ORAL_TABLET | Freq: Every day | ORAL | Status: DC
  Administered 2020-08-09: 25 mg via ORAL
  Filled 2020-08-08 (×3): qty 1

## 2020-08-08 MED ORDER — INSULIN ASPART 100 UNIT/ML IJ SOLN: 0.0000 [IU] | Freq: Four times a day (QID) | INTRAMUSCULAR | Status: DC

## 2020-08-08 MED ORDER — AMLODIPINE BESYLATE 5 MG PO TABS
5.0000 mg | ORAL_TABLET | Freq: Every morning | ORAL | Status: DC
  Administered 2020-08-09: 5 mg via ORAL
  Filled 2020-08-08: qty 1

## 2020-08-08 MED ORDER — INSULIN GLARGINE 100 UNIT/ML ~~LOC~~ SOLN
18.0000 [IU] | Freq: Every day | SUBCUTANEOUS | Status: DC
  Filled 2020-08-08: qty 0.18

## 2020-08-08 MED ORDER — ONDANSETRON HCL 4 MG/2ML IJ SOLN: 4.0000 mg | Freq: Four times a day (QID) | INTRAMUSCULAR | Status: DC | PRN

## 2020-08-08 MED ORDER — ASPIRIN EC 81 MG PO TBEC
81.0000 mg | DELAYED_RELEASE_TABLET | Freq: Every day | ORAL | Status: DC
  Administered 2020-08-09: 81 mg via ORAL
  Filled 2020-08-08: qty 1

## 2020-08-08 MED ORDER — ONDANSETRON HCL 4 MG PO TABS
4.0000 mg | ORAL_TABLET | Freq: Four times a day (QID) | ORAL | Status: DC | PRN
Start: 2020-08-08 — End: 2020-08-09

## 2020-08-08 NOTE — ED Notes (Signed)
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, when he told the homeowner he was not feeling well and was going to sit in his truck for a moment; the homeowner reports she waited 15 mins and went outside to find pt sitting in truck, blue in color and not breathing; homeowner states she beat on pt's chest a couple of times and pt took some agonal breaths; she then laid the seat down in the pt's truck and started to do CPR due to no carotid pulse and no radial pulse; homeowner states she did cpr for approximately 10 mins pta of ems; upon arrival to ED pt states he felt like he got too hot or was having an allergic reaction to something and went to his truck to get his epipen; pt states he does not remember anything until arrival of ems and he states he was not able to see when ems initially arrived but on the way to the hospital his sight came back;  Pt alert and oriented upon arrival to room

## 2020-08-08 NOTE — ED Triage Notes (Signed)
Pt brought in by [facility] from home with c/o becoming unresponsive after walking outside the house. Pt was working at [employer] and went outside because he started feeling bad and like he was going to pass out. He was going to take some liquid Benadryl but couldn't find any in his car (pt has multiple allergies and keeps liquid Benadryl and Epi-pen in his car at all times) so he grabbed his Epi-Pen. Pt was not stung by a bee that he was aware of but felt as if he was having a reaction because he was felt so bad. Pt was then found unresponsive in his truck by a bystander with his Epi-Pen in his hand and they gave him the Epi-Pen and couldn't feel a pulse and reports he had agonal breathing. CPR was started. He became conscious and wouldn't follow directions appropriately and initially couldn't see. EMS reports pt was pale and diaphoretic when they arrived and his BP was 88/50 with HR 120. They started an IV and started giving fluids. EMS reports once they moved him into the air conditioned truck he started feeling better. EMS reports he could see for them and followed commands appropriately.

## 2020-08-08 NOTE — ED Notes (Signed)
Date and time results received: 08/08/20 1506 (use smartphrase ".now" to insert current time)  Test: lactic Critical Value: 2.2  Name of Provider Notified: Deno Etienne PA  Orders Received? Or Actions Taken?: no/na

## 2020-08-08 NOTE — ED Notes (Signed)
Patient transported to CT 

## 2020-08-08 NOTE — ED Notes (Signed)
Pt tolerating PO fluid without difficulty

## 2020-08-08 NOTE — ED Provider Notes (Signed)
[facility] Provider Note   CSN: [id number] Arrival date & time: 08/08/20  1258     History No chief complaint on file.   Douglas Nunez is a 65 y.o. male.  HPI   Patient with significant medical history of anaphylaxis due to bees, diabetes, hypertension presents via EMS after having a syncopal episode.  Patient states he was working in a closet today fixing some plumbing, he then  went to stand up and felt very lightheaded and dizziness, he started to get sweaty and went to walk to his truck as he though he was having a allergic reaction.  Patient does not remember anything after this, he states that he then woke up inside an ambulance.  He was told that he passed out in his truck, his workers found him passed out and struck with agonal breathing and they could not feel pulse initiated CPR.  Patient then came to as EMS arrived, the bystanders did give him his dose of his EpiPen.  While in the ambulance patient received fluids and he started to feel much better.  Patient currently has no complaints at this time, he does states he feels dry, but denies headaches, change in vision, paresthesias or weakness in the upper or lower extremities, he denies chest pain or shortness of breath.  He has no  cardiac history, no history of seizure disorders, no history of CVAs.  Patient states he thinks he may have been bit by something but denies systemic rash, feeling itchy, having tongue or throat swelling, he denies becoming incontinent or biting his tongue.  Patient denies headaches, fevers, chills, shortness of breath, chest pain, abdominal pain, nausea, vomiting, diarrhea, worsening pedal edema.  Past Medical History:  Diagnosis Date  . Allergy   . Anaphylaxis due to hymenoptera venom   . Cataract 2016, 2017   bilateral extractions with lens implant bilaterally  . Compression fracture of thoracic vertebra (HCC) T11    seen on CT  . Degenerative joint disease (DJD) of lumbar spine    . Diabetes mellitus without complication ([place])   . GERD (gastroesophageal reflux disease)   . Hx of adenomatous colonic polyps 10/07/2018  . Hypertension   . 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 Skin cancer    several places removed, and one Moths surgery on right temple  . Vasovagal syncope 2018   associated with diarrhea, hyperglycemia    Patient Active Problem List   Diagnosis Date Noted  . Syncope 08/08/2020  . Coronary artery disease 12/10/2019  . Hx of adenomatous colonic polyps 10/07/2018  . Hyperlipidemia 09/25/2018  . Family history of heart disease 09/25/2018  . Vasovagal syncope 05/23/2016  . Retinal detachment 09/08/2014  . Essential hypertension 09/08/2014  . DM (diabetes mellitus) type 2, uncontrolled, with ketoacidosis ([place]) 09/08/2014  . GERD (gastroesophageal reflux disease) 09/08/2014    Past Surgical History:  Procedure Laterality Date  . APPENDECTOMY     age 65  . CATARACT EXTRACTION Bilateral    and lens implanted  . KNEE SURGERY Left   . LASIK Bilateral   . MOHS SURGERY Right    temple  . RETINAL DETACHMENT SURGERY Right   . SKIN BIOPSY     multiple skin cancers removed       Family History  Problem Relation Age of Onset  . Diabetes Father   . Heart attack Father        multiple   . Lung cancer Mother  light smoker  . Cancer - Colon Neg Hx   . Esophageal cancer Neg Hx   . Rectal cancer Neg Hx     Social History   Tobacco Use  . Smoking status: Never Smoker  . Smokeless tobacco: Never Used  Vaping Use  . Vaping Use: Never used  Substance Use Topics  . Alcohol use: Yes    Alcohol/week: 0.0 standard drinks    Comment: occ  . Drug use: No    Home Medications Prior to Admission medications   Medication Sig Start Date End Date Taking? Authorizing Provider  Alirocumab (PRALUENT) 150 MG/ML SOAJ Inject 150 mg into the skin every 14 (fourteen) days. 03/29/20  Yes Lorretta Harp, MD  amLODipine (NORVASC) 5 MG tablet Take 5  mg every morning by mouth.    Yes [provider]  aspirin 81 MG tablet Take 81 mg by mouth daily.   Yes [provider]  empagliflozin (JARDIANCE) 25 MG TABS tablet Take 25 mg by mouth daily.    Yes [provider]  EPINEPHrine 0.3 mg/0.3 mL IJ SOAJ injection Inject 0.3 mg into the muscle once.   Yes [provider]  Insulin Glargine (BASAGLAR KWIKPEN) 100 UNIT/ML Inject 18 Units into the skin daily. 07/31/20  Yes [provider]  loratadine (CLARITIN) 10 MG tablet Take 10 mg by mouth 2 (two) times daily.   Yes [provider]  losartan (COZAAR) 25 MG tablet Take 25 mg by mouth daily. 06/11/20  Yes [provider]  pantoprazole (PROTONIX) 40 MG tablet Take 40 mg by mouth 2 (two) times daily before a meal.    Yes [provider]  atorvastatin (LIPITOR) 40 MG tablet Take 1 tablet (40 mg total) by mouth daily. Patient not taking: Reported on 08/08/2020 11/17/18 12/10/19  Lorretta Harp, MD  [facility] VERIO test strip 3 (three) times daily. 11/09/19   [provider]  rosuvastatin (CRESTOR) 5 MG tablet Take 1 tablet by mouth each Monday, Wednesday and Friday. Patient not taking: Reported on 08/08/2020 12/14/19   Lorretta Harp, MD    Allergies    Bee venom, Trulicity [dulaglutide], Victoza [liraglutide], and Nsaids  Review of Systems   Review of Systems  Constitutional: Negative for chills and fever.  HENT: Negative for congestion and tinnitus.   Respiratory: Negative for shortness of breath.   Cardiovascular: Negative for chest pain.  Gastrointestinal: Negative for abdominal pain, nausea and vomiting.  Genitourinary: Negative for enuresis.  Musculoskeletal: Negative for back pain.  Skin: Negative for rash.  Neurological: Negative for dizziness and headaches.  Hematological: Does not bruise/bleed easily.    Physical Exam Updated Vital Signs BP 119/74   Pulse 84   Temp (!) 97.5 F (36.4 C) (Oral)   Resp (!)  23   Ht 5' 9.5" (1.765 m)   Wt 84.4 kg   SpO2 99%   BMI 27.07 kg/m   Physical Exam Vitals and nursing note reviewed.  Constitutional:      General: He is not in acute distress.    Appearance: He is not ill-appearing.  HENT:     Head: Normocephalic and atraumatic.     Nose: No congestion.     Mouth/Throat:     Mouth: Mucous membranes are dry.     Pharynx: Oropharynx is clear. No oropharyngeal exudate or posterior oropharyngeal erythema.  Eyes:     Conjunctiva/sclera: Conjunctivae normal.  Cardiovascular:     Rate and Rhythm: Normal rate and regular rhythm.  Pulses: Normal pulses.     Heart sounds: No murmur heard. No friction rub. No gallop.   Pulmonary:     Effort: No respiratory distress.     Breath sounds: No wheezing, rhonchi or rales.  Abdominal:     Palpations: Abdomen is soft.     Tenderness: There is no abdominal tenderness.  Musculoskeletal:     Right lower leg: No edema.     Left lower leg: No edema.     Comments: Patient is 5 of 5 strength, neurovascular intact in the upper lower extremities.  Skin:    General: Skin is warm and dry.  Neurological:     Mental Status: He is alert.     GCS: GCS eye subscore is 4. GCS verbal subscore is 5. GCS motor subscore is 6.     Sensory: Sensation is intact.     Motor: No weakness.     Coordination: Romberg sign negative. Finger-Nose-Finger Test and Heel to [place] Test normal.     Comments: Cranial nerves II through XII are grossly intact  Patient is having no difficulty word finding.  Psychiatric:        Mood and Affect: Mood normal.     ED Results / Procedures / Treatments   Labs (all labs ordered are listed, but only abnormal results are displayed) Labs Reviewed  COMPREHENSIVE METABOLIC PANEL - Abnormal; Notable for the following components:      Result Value   Glucose, Bld 190 (*)    Calcium 8.4 (*)    Anion gap 4 (*)    All other components within normal limits  LACTIC ACID, PLASMA - Abnormal; Notable for  the following components:   Lactic Acid, Venous 2.2 (*)    All other components within normal limits  CBG MONITORING, ED - Abnormal; Notable for the following components:   Glucose-Capillary 214 (*)    All other components within normal limits  TROPONIN I (HIGH SENSITIVITY) - Abnormal; Notable for the following components:   Troponin I (High Sensitivity) 28 (*)    All other components within normal limits  TROPONIN I (HIGH SENSITIVITY) - Abnormal; Notable for the following components:   Troponin I (High Sensitivity) 77 (*)    All other components within normal limits  SARS CORONAVIRUS 2 (TAT 6-24 HRS)  CBC WITH DIFFERENTIAL/PLATELET  LACTIC ACID, PLASMA    EKG EKG Interpretation  Date/Time:  Tuesday Aug 08 2020 14:13:18 EDT Ventricular Rate:  78 PR Interval:  160 QRS Duration: 91 QT Interval:  392 QTC Calculation: 447 R Axis:   30 Text Interpretation: Sinus rhythm Anteroseptal infarct, old No significant change since prior 3/18 Confirmed by Aletta Edouard [phone]) on 08/08/2020 5:19:42 PM   Radiology DG Chest Port 1 View  Result Date: 08/08/2020 CLINICAL DATA:  Syncopal episode today EXAM: PORTABLE CHEST 1 VIEW COMPARISON:  09/07/2019 chest radiograph. FINDINGS: Stable cardiomediastinal silhouette with top-normal heart size. No pneumothorax. No pleural effusion. Lungs appear clear, with no acute consolidative airspace disease and no pulmonary edema. IMPRESSION: No active disease. Electronically Signed   By: Ilona Sorrel M.D.   On: 08/08/2020 14:37    Procedures Procedures   Medications Ordered in ED Medications  sodium chloride 0.9 % bolus 1,000 mL (0 mLs Intravenous Stopped 08/08/20 1632)  sodium chloride 0.9 % bolus 1,000 mL (1,000 mLs Intravenous New Bag/Given 08/08/20 1635)    ED Course  I have reviewed the triage vital signs and the nursing notes.  Pertinent labs & imaging results that  were available during my care of the patient were reviewed by me and considered in  my medical decision making (see chart for details).    MDM Rules/Calculators/A&P                         .Initial impression-patient presents after a syncopal episode.  He is alert, does not appear to be in acute distress, vital signs reassuring.  Suspect orthostatic vasovagal, will obtain basic lab work-up, chest x-ray, EKG and reassess.  Work-up-CBC unremarkable, CMP shows slight hyperglycemia 190, first troponin is 28, second trop 77 lactic 2.2, second lactic 1.1, chest x-ray negative for acute findings.  EKG sinus without signs of ischemia  Reassessment-patient was reassessed after bolus of fluid, states he feels much better, has no complaints at this time.  I suspect patient is severely dehydrated, will provide patient with another liter of fluids.  Second troponin elevated from 28-77 patient continues to have no chest pain at this time.  Due to elevation will recommend admission for further work-up.  Patient is agreement to this.  Will consult with hospitalist and cardiology for further recommendations.  Consult- 1. Spoke with Dr. Sallyanne Kuster of the cardiology team he feels this is most likely demand ischemia and recommends continue trending of troponins and formal consult by cards team tomorrow.  2. Spoke with Dr. Su Ley of the hospitalist team, she has accepted the patient come down and evaluate.  Rule out-I have low suspicion for CVA or intracranial head bleed as there is no focal deficits present my exam.  Low suspicion for seizure as patient did not become incontinent, no tongue biting, he has no history of this.  Low suspicion for systemic infection and/or meningitis as there is no meningeal signs on my exam, no leukocytosis, vital signs reassuring.  I have low suspicion for ACS as patient denies chest pain, shortness of breath, history is atypical, EKG without signs of ischemia.  Patient does have an uptrending troponin but I feel this is more likely due to demand ischemia as he was  dehydrated as well had a dose of epinephrine.  Low suspicion for PE patient denies pleuritic chest pain, shortness of breath, vital signs are reassuring.  Low suspicion for AAA as patient has no history of this patient has no risk factors.  Plan-admit to medicine due to elevated troponin secondary to demand ischemia.   Final Clinical Impression(s) / ED Diagnoses Final diagnoses:  Syncope, unspecified syncope type  Elevated troponin    Rx / DC Orders ED Discharge Orders    None       Marcello Fennel, PA-C 08/08/20 1811    Fredia Sorrow, MD 08/10/20 [phone]

## 2020-08-08 NOTE — ED Notes (Signed)
Pt given cup of water, bed repositioned and lights cut down for pt comfort

## 2020-08-08 NOTE — H&P (Addendum)
History and Physical    Douglas Nunez CVE:[id number] DOB: 05/10/1955 DOA: 08/08/2020  PCP: Lemmie Evens, MD   Patient coming from: Home  I have personally briefly reviewed patient's old medical records in [place]  Chief Complaint: Unresponsiveness  HPI: Douglas Nunez is a 65 y.o. male with medical history significant for hypertension and diabetes mellitus.  Patient was brought to the ED via EMS. Patient was in his normal state of health, working on water leak inside a small inclusion/closet (possible mold present) lying on the floor, got up and felt dizzy.  Reports the house was warm and shows no AC.  He felt he was having an allergic reaction, so he went to his car in an attempt to cool down, put his seat back down, take his liquid Benadryl and EpiPen.  This is what he normally does when he gets an allergic reaction.  He is allergic to bees, wasp and anti-inflammatories- NSAIDs, possibly metformin also. He got to his car, was holding his EpiPen when he passed out.  One of the owners of the house who is a nurse found patient in his car unresponsive, not breathing, reported pupils were dilated, could not find a pulse, started CPR while patient was in the car.  She did CPR on patient for about 10 minutes.  Owner's daughter who is also an ED nurse here was called, she brought patient out of the car, and started CPR on the floor.  She also did not find a carotid or radial pulse, reported pupils were pinpoint, patient was very diaphoretic, patient initially was not breathing and later started having agonal breathing.  On thinking patient had an allergic reaction, gave patient an injection from his EpiPen.  Blood sugar was checked and it was 257.  Patient reports he ate breakfast, blood sugars in the morning were normal at 141.  He denies chest pain, had no difficulty breathing.   Patient reports prior similar episodes of completely passing out, without pulse and not breathing at all from  allergic reactions.  So he always has his liquid Benadryl and EpiPen on hand.  Normally with his allergic reactions, he has itching in his scalp, he has whelps at the location of the insect bite, but not on his body, and he has redness that spreads from his head to his upper chest. None of these happened this time.  On recovering from this episode, en route with EMS, patient reports he was not able to see until he almost arrived at the ED. EMS reports patient's blood pressure was 88/50 and heart rate is 120.  IV fluid boluses were given.  Not had any unusual foods.  He does not think he was bitten by a bug as he was indoors.  Reports with prior allergic reactions, he recovers quickly and if he gets his Benadryl and EpiPen injection.  He has never had problems with his vision with allergic reactions.   ED Course: Temp 97.5.  Heart rate 80s.  Respiratory rate 14-24.  Blood pressure systolic 025-852.  O2 sats greater than 98% on room air.  Lactic acid 2.2 > 1.1.  EKG shows sinus rhythm, without significant change from prior.  Troponins 28 >> 77.  Chest xray- Without acute abnormality.  EDP talked to cardiologist Dr. Sallyanne Kuster, felt elevated troponin may be due to demand ischemia, recommended admission and trending his troponins.  Cardiology will see in the morning.   Review of Systems: As per HPI all other systems reviewed and negative.  Past Medical History:  Diagnosis Date  . Allergy   . Anaphylaxis due to hymenoptera venom   . Cataract 2016, 2017   bilateral extractions with lens implant bilaterally  . Compression fracture of thoracic vertebra (HCC) T11    seen on CT  . Degenerative joint disease (DJD) of lumbar spine   . Diabetes mellitus without complication ([place])   . GERD (gastroesophageal reflux disease)   . Hx of adenomatous colonic polyps 10/07/2018  . Hypertension   . 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 Skin cancer    several places removed, and one Moths surgery on right temple  .  Vasovagal syncope 2018   associated with diarrhea, hyperglycemia    Past Surgical History:  Procedure Laterality Date  . APPENDECTOMY     age 48  . CATARACT EXTRACTION Bilateral    and lens implanted  . KNEE SURGERY Left   . LASIK Bilateral   . MOHS SURGERY Right    temple  . RETINAL DETACHMENT SURGERY Right   . SKIN BIOPSY     multiple skin cancers removed     reports that he has never smoked. He has never used smokeless tobacco. He reports current alcohol use. He reports that he does not use drugs.  Allergies  Allergen Reactions  . Bee Venom Anaphylaxis  . Trulicity [Dulaglutide] Other (See Comments)    Belching, stomach cramps, diarrhea  . Victoza [Liraglutide] Nausea Only  . Nsaids Itching and Rash    Family History  Problem Relation Age of Onset  . Diabetes Father   . Heart attack Father        multiple   . Lung cancer Mother        light smoker  . Cancer - Colon Neg Hx   . Esophageal cancer Neg Hx   . Rectal cancer Neg Hx     Prior to Admission medications   Medication Sig Start Date End Date Taking? Authorizing Provider  Alirocumab (PRALUENT) 150 MG/ML SOAJ Inject 150 mg into the skin every 14 (fourteen) days. 03/29/20  Yes Lorretta Harp, MD  amLODipine (NORVASC) 5 MG tablet Take 5 mg every morning by mouth.    Yes [provider]  aspirin 81 MG tablet Take 81 mg by mouth daily.   Yes [provider]  empagliflozin (JARDIANCE) 25 MG TABS tablet Take 25 mg by mouth daily.    Yes [provider]  EPINEPHrine 0.3 mg/0.3 mL IJ SOAJ injection Inject 0.3 mg into the muscle once.   Yes [provider]  Insulin Glargine (BASAGLAR KWIKPEN) 100 UNIT/ML Inject 18 Units into the skin daily. 07/31/20  Yes [provider]  loratadine (CLARITIN) 10 MG tablet Take 10 mg by mouth 2 (two) times daily.   Yes [provider]  losartan (COZAAR) 25 MG tablet Take 25 mg by mouth daily. 06/11/20  Yes [provider]   pantoprazole (PROTONIX) 40 MG tablet Take 40 mg by mouth 2 (two) times daily before a meal.    Yes [provider]  atorvastatin (LIPITOR) 40 MG tablet Take 1 tablet (40 mg total) by mouth daily. Patient not taking: Reported on 08/08/2020 11/17/18 12/10/19  Lorretta Harp, MD  [facility] VERIO test strip 3 (three) times daily. 11/09/19   [provider]  rosuvastatin (CRESTOR) 5 MG tablet Take 1 tablet by mouth each Monday, Wednesday and Friday. Patient not taking: Reported on 08/08/2020 12/14/19   Lorretta Harp, MD    Physical  Exam: Vitals:   08/08/20 1600 08/08/20 1630 08/08/20 1700 08/08/20 1730  BP: 111/71 114/85 122/79 119/74  Pulse: 84 80 80 84  Resp: 20 (!) 22 (!) 23 (!) 23  Temp:      TempSrc:      SpO2: 98% 98% 99% 99%  Weight:      Height:        Constitutional: NAD, calm, comfortable Vitals:   08/08/20 1600 08/08/20 1630 08/08/20 1700 08/08/20 1730  BP: 111/71 114/85 122/79 119/74  Pulse: 84 80 80 84  Resp: 20 (!) 22 (!) 23 (!) 23  Temp:      TempSrc:      SpO2: 98% 98% 99% 99%  Weight:      Height:       Eyes: PERRL, lids and conjunctivae normal ENMT: Mucous membranes are moist. Neck: normal, supple, no masses, no thyromegaly Respiratory: clear to auscultation bilaterally, no wheezing, no crackles. Normal respiratory effort. No accessory muscle use.  Cardiovascular: Regular rate and rhythm, no murmurs / rubs / gallops. No extremity edema. 2+ pedal pulses.  Abdomen: no tenderness, no masses palpated. No hepatosplenomegaly. Bowel sounds positive.  Musculoskeletal: no clubbing / cyanosis. No joint deformity upper and lower extremities. Good ROM, no contractures. Normal muscle tone.  Skin: no rashes, lesions, ulcers. No induration Neurologic: No facial symmetric, speech clear and fluent without evidence of aphasia, vision intact, sensation intact globally.  5/5 strength in all extremities Psychiatric: Normal judgment and insight. Alert and oriented  x 3. Normal mood.   Labs on Admission: I have personally reviewed following labs and imaging studies  CBC: Recent Labs  Lab 08/08/20 1421  WBC 8.4  NEUTROABS 7.3  HGB 16.4  HCT 49.1  MCV 93.9  PLT 767   Basic Metabolic Panel: Recent Labs  Lab 08/08/20 1421  NA 138  K 3.6  CL 106  CO2 28  GLUCOSE 190*  BUN 20  CREATININE 1.10  CALCIUM 8.4*   Liver Function Tests: Recent Labs  Lab 08/08/20 1421  AST 18  ALT 18  ALKPHOS 64  BILITOT 1.0  PROT 7.2  ALBUMIN 4.0   CBG: Recent Labs  Lab 08/08/20 1310  GLUCAP 214*    Radiological Exams on Admission: DG Chest Port 1 View  Result Date: 08/08/2020 CLINICAL DATA:  Syncopal episode today EXAM: PORTABLE CHEST 1 VIEW COMPARISON:  09/07/2019 chest radiograph. FINDINGS: Stable cardiomediastinal silhouette with top-normal heart size. No pneumothorax. No pleural effusion. Lungs appear clear, with no acute consolidative airspace disease and no pulmonary edema. IMPRESSION: No active disease. Electronically Signed   By: Ilona Sorrel M.D.   On: 08/08/2020 14:37    EKG: Independently reviewed.  Sinus rhythm, rate 78, QTc 447.  No significant change from prior EKG.  Assessment/Plan Principal Problem:   Episode of unresponsiveness Active Problems:   Essential hypertension   Syncope  Episode of unresponsiveness/syncope -without pulse or respirations required CPR, now completely back to baseline.  Reports episodes of loss of vision, now completely resolved.  EMS reports blood pressure 88/50, with heart rates of 120s.  Troponins 28 >> 77.  EKG unchanged.  Status post 2 L bolus.  Differentials include cardiac etiology versus new as yet unidentified allergy with anaphylactic reaction. -Obtain orthostatic vitals-positive for increase in heart rate from 81-102. -Obtain head CT-no acute abnormality -Trend troponin -Echocardiogram -Check magnesium, TSH -Cardiology consultation in a.m. -N.p.o. midnight pending cardiology  evaluation  History of coronary artery disease-mild CAD with coronary calcium score 130.  Disease in the mid and distal portion of the first diagonal branch.  Undergoing medical therapy, follows with Dr. Gwenlyn Found. -Resume aspirin - Also on alirocumab injections.  Hypertension-stable. -Resume Norvasc, losartan  Diabetes mellitus random glucose 190 - SSI- M -Resume home Lantus 18 units daily -Resume Jardiance   DVT prophylaxis: Lovenox Code Status: Full code Family Communication: Spouse at bedside Disposition Plan: ~ 1-2 days Consults called:  Cardiology Admission status: Obs, tele  Bethena Roys MD [facility]  08/08/2020, 7:50 PM

## 2020-08-09 ENCOUNTER — Encounter (HOSPITAL_COMMUNITY): Payer: Self-pay | Admitting: Internal Medicine

## 2020-08-09 ENCOUNTER — Observation Stay (HOSPITAL_BASED_OUTPATIENT_CLINIC_OR_DEPARTMENT_OTHER): Payer: Medicare Other

## 2020-08-09 DIAGNOSIS — I10 Essential (primary) hypertension: Secondary | ICD-10-CM | POA: Diagnosis not present

## 2020-08-09 DIAGNOSIS — R778 Other specified abnormalities of plasma proteins: Secondary | ICD-10-CM | POA: Diagnosis not present

## 2020-08-09 DIAGNOSIS — I340 Nonrheumatic mitral (valve) insufficiency: Secondary | ICD-10-CM | POA: Diagnosis not present

## 2020-08-09 DIAGNOSIS — R55 Syncope and collapse: Secondary | ICD-10-CM

## 2020-08-09 DIAGNOSIS — R4189 Other symptoms and signs involving cognitive functions and awareness: Secondary | ICD-10-CM | POA: Diagnosis not present

## 2020-08-09 DIAGNOSIS — R7989 Other specified abnormal findings of blood chemistry: Secondary | ICD-10-CM

## 2020-08-09 LAB — ECHOCARDIOGRAM COMPLETE
Area-P 1/2: 3.66 cm2
Height: 69.5 in
S' Lateral: 3.39 cm
Weight: 2976 oz

## 2020-08-09 LAB — CBC
HCT: 42.8 % (ref 39.0–52.0)
Hemoglobin: 14 g/dL (ref 13.0–17.0)
MCH: 30.4 pg (ref 26.0–34.0)
MCHC: 32.7 g/dL (ref 30.0–36.0)
MCV: 92.8 fL (ref 80.0–100.0)
Platelets: 209 10*3/uL (ref 150–400)
RBC: 4.61 MIL/uL (ref 4.22–5.81)
RDW: 12.7 % (ref 11.5–15.5)
WBC: 8.6 10*3/uL (ref 4.0–10.5)
nRBC: 0 % (ref 0.0–0.2)

## 2020-08-09 LAB — TROPONIN I (HIGH SENSITIVITY): Troponin I (High Sensitivity): 46 ng/L — ABNORMAL HIGH (ref <18)

## 2020-08-09 LAB — BASIC METABOLIC PANEL
Anion gap: 6 (ref 5–15)
BUN: 14 mg/dL (ref 8–23)
CO2: 23 mmol/L (ref 22–32)
Calcium: 8.4 mg/dL — ABNORMAL LOW (ref 8.9–10.3)
Chloride: 109 mmol/L (ref 98–111)
Creatinine, Ser: 0.82 mg/dL (ref 0.61–1.24)
GFR, Estimated: >60 mL/min (ref >60)
Glucose, Bld: 93 mg/dL (ref 70–99)
Potassium: 4 mmol/L (ref 3.5–5.1)
Sodium: 138 mmol/L (ref 135–145)

## 2020-08-09 LAB — HIV ANTIBODY (ROUTINE TESTING W REFLEX): HIV Screen 4th Generation wRfx: NONREACTIVE

## 2020-08-09 LAB — GLUCOSE, CAPILLARY
Glucose-Capillary: 87 mg/dL (ref 70–99)
Glucose-Capillary: 115 mg/dL — ABNORMAL HIGH (ref 70–99)
Glucose-Capillary: 91 mg/dL (ref 70–99)
Glucose-Capillary: 183 mg/dL — ABNORMAL HIGH (ref 70–99)

## 2020-08-09 LAB — SARS CORONAVIRUS 2 (TAT 6-24 HRS): SARS Coronavirus 2: NEGATIVE

## 2020-08-09 MED ORDER — INSULIN GLARGINE 100 UNIT/ML ~~LOC~~ SOLN
10.0000 [IU] | Freq: Every day | SUBCUTANEOUS | Status: DC
  Filled 2020-08-09 (×2): qty 0.1

## 2020-08-09 NOTE — Discharge Summary (Signed)
Physician Discharge Summary  Douglas Nunez UXN:[id number] DOB: May 17, 1955 DOA: 08/08/2020  PCP: Lemmie Evens, MD  Admit date: 08/08/2020 Discharge date: 08/09/2020  Admitted From: home Disposition:  Home   Recommendations for Outpatient Follow-up:  1. Follow up with PCP in 1-2 weeks     Discharge Condition: Stable CODE STATUS: FULL Diet recommendation: Heart Healthy    Brief/Interim Summary:  65 y.o. male with medical history significant for hypertension and diabetes mellitus.  Patient was brought to the ED via EMS due to episode of unresponsiveness Patient was in his normal state of health, working on water leak inside a small inclusion/closet (possible mold present) lying on the floor, got up and felt dizzy.  Reports the house was warm and shows no AC.  He felt he was having an allergic reaction, so he went to his car in an attempt to cool down, put his seat back down, take his liquid Benadryl and EpiPen.  This is what he normally does when he gets an allergic reaction.  He is allergic to bees, wasp and anti-inflammatories- NSAIDs, possibly metformin also. He got to his car, was holding his EpiPen when he passed out.   One of the owners of the house who is a nurse found patient in his car unresponsive, not breathing, reported pupils were dilated, could not find a pulse, started CPR while patient was in the car.  was very diaphoretic, patient initially was not breathing and later started having agonal breathing.  On thinking patient had an allergic reaction, gave patient an injection from his EpiPen.  Blood sugar was checked and it was 257.  EMS reports patient's blood pressure was 88/50 and heart rate is 120.  IV fluid boluses were given  Discharge Diagnoses:  Syncope/Unresponsiveness -likely vasovagal -cardiology consult appreciated-->likely vasovagal-->ok to go home if echo ok -08/09/20 Echo--EF 55-60%, no WMA -given 2L fluids in ED>>symptomatically improved  Elevated  troponin -likely demand ischemia -EKG--no concerning findings  coronary artery disease -mild CAD with coronary calcium score 130.  Disease in the mid and distal portion of the first diagonal branch.  Undergoing medical therapy, follows with Dr. Gwenlyn Found. -Resume aspirin - Also on alirocumab injections.  Diabetes mellitus  - SSI- Moderate scale -Resume home Lantus 18 units daily -Resume Jardiance  Discharge Instructions   Allergies as of 08/09/2020      Reactions   Bee Venom Anaphylaxis   Trulicity [dulaglutide] Other (See Comments)   Belching, stomach cramps, diarrhea   Victoza [liraglutide] Nausea Only   Nsaids Itching, Rash      Medication List    STOP taking these medications   atorvastatin 40 MG tablet Commonly known as: LIPITOR   rosuvastatin 5 MG tablet Commonly known as: CRESTOR     TAKE these medications   amLODipine 5 MG tablet Commonly known as: NORVASC Take 5 mg every morning by mouth.   aspirin 81 MG tablet Take 81 mg by mouth daily.   Basaglar KwikPen 100 UNIT/ML Inject 18 Units into the skin daily.   empagliflozin 25 MG Tabs tablet Commonly known as: JARDIANCE Take 25 mg by mouth daily.   EPINEPHrine 0.3 mg/0.3 mL Soaj injection Commonly known as: EPI-PEN Inject 0.3 mg into the muscle once.   loratadine 10 MG tablet Commonly known as: CLARITIN Take 10 mg by mouth 2 (two) times daily.   losartan 25 MG tablet Commonly known as: COZAAR Take 25 mg by mouth daily.   OneTouch Verio test strip Generic drug: glucose blood 3 (three) times  daily.   pantoprazole 40 MG tablet Commonly known as: PROTONIX Take 40 mg by mouth 2 (two) times daily before a meal.   Praluent 150 MG/ML Soaj Generic drug: Alirocumab Inject 150 mg into the skin every 14 (fourteen) days.       Allergies  Allergen Reactions  . Bee Venom Anaphylaxis  . Trulicity [Dulaglutide] Other (See Comments)    Belching, stomach cramps, diarrhea  . Victoza [Liraglutide] Nausea  Only  . Nsaids Itching and Rash    Consultations:  cardiology   Procedures/Studies: CT HEAD WO CONTRAST  Result Date: 08/08/2020 CLINICAL DATA:  Unresponsive, temporary loss of vision. EXAM: CT HEAD WITHOUT CONTRAST TECHNIQUE: Contiguous axial images were obtained from the base of the skull through the vertex without intravenous contrast. COMPARISON:  None. FINDINGS: Brain: Mild cerebral atrophy. No acute intracranial abnormality. Specifically, no hemorrhage, hydrocephalus, mass lesion, acute infarction, or significant intracranial injury. Vascular: No hyperdense vessel or unexpected calcification. Skull: No acute calvarial abnormality. Sinuses/Orbits: No acute findings Other: None IMPRESSION: No acute intracranial abnormality. Electronically Signed   By: Rolm Baptise M.D.   On: 08/08/2020 19:20   DG Chest Port 1 View  Result Date: 08/08/2020 CLINICAL DATA:  Syncopal episode today EXAM: PORTABLE CHEST 1 VIEW COMPARISON:  09/07/2019 chest radiograph. FINDINGS: Stable cardiomediastinal silhouette with top-normal heart size. No pneumothorax. No pleural effusion. Lungs appear clear, with no acute consolidative airspace disease and no pulmonary edema. IMPRESSION: No active disease. Electronically Signed   By: Ilona Sorrel M.D.   On: 08/08/2020 14:37        Discharge Exam: Vitals:   08/09/20 0353 08/09/20 0752  BP: 118/72 115/72  Pulse: 71 66  Resp: 18 16  Temp: 98.4 F (36.9 C) 98.2 F (36.8 C)  SpO2: 99% 99%   Vitals:   08/08/20 2050 08/08/20 2352 08/09/20 0353 08/09/20 0752  BP: 125/68 105/65 118/72 115/72  Pulse: 77 76 71 66  Resp: 20 18 18 16   Temp: 98.8 F (37.1 C) 98.9 F (37.2 C) 98.4 F (36.9 C) 98.2 F (36.8 C)  TempSrc: Oral Oral  Oral  SpO2: 100% 95% 99% 99%  Weight:      Height:        General: Pt is alert, awake, not in acute distress Cardiovascular: RRR, S1/S2 +, no rubs, no gallops Respiratory: CTA bilaterally, no wheezing, no rhonchi Abdominal: Soft,  NT, ND, bowel sounds + Extremities: no edema, no cyanosis   The results of significant diagnostics from this hospitalization (including imaging, microbiology, ancillary and laboratory) are listed below for reference.    Significant Diagnostic Studies: CT HEAD WO CONTRAST  Result Date: 08/08/2020 CLINICAL DATA:  Unresponsive, temporary loss of vision. EXAM: CT HEAD WITHOUT CONTRAST TECHNIQUE: Contiguous axial images were obtained from the base of the skull through the vertex without intravenous contrast. COMPARISON:  None. FINDINGS: Brain: Mild cerebral atrophy. No acute intracranial abnormality. Specifically, no hemorrhage, hydrocephalus, mass lesion, acute infarction, or significant intracranial injury. Vascular: No hyperdense vessel or unexpected calcification. Skull: No acute calvarial abnormality. Sinuses/Orbits: No acute findings Other: None IMPRESSION: No acute intracranial abnormality. Electronically Signed   By: Rolm Baptise M.D.   On: 08/08/2020 19:20   DG Chest Port 1 View  Result Date: 08/08/2020 CLINICAL DATA:  Syncopal episode today EXAM: PORTABLE CHEST 1 VIEW COMPARISON:  09/07/2019 chest radiograph. FINDINGS: Stable cardiomediastinal silhouette with top-normal heart size. No pneumothorax. No pleural effusion. Lungs appear clear, with no acute consolidative airspace disease and no pulmonary edema. IMPRESSION:  No active disease. Electronically Signed   By: Ilona Sorrel M.D.   On: 08/08/2020 14:37     Microbiology: Recent Results (from the past 240 hour(s))  SARS CORONAVIRUS 2 (Luciano Cinquemani 6-24 HRS) Nasopharyngeal Nasopharyngeal Swab     Status: None   Collection Time: 08/08/20  5:58 PM   Specimen: Nasopharyngeal Swab  Result Value Ref Range Status   SARS Coronavirus 2 NEGATIVE NEGATIVE Final    Comment: (NOTE) SARS-CoV-2 target nucleic acids are NOT DETECTED.  The SARS-CoV-2 RNA is generally detectable in upper and lower respiratory specimens during the acute phase of infection.  Negative results do not preclude SARS-CoV-2 infection, do not rule out co-infections with other pathogens, and should not be used as the sole basis for treatment or other patient management decisions. Negative results must be combined with clinical observations, patient history, and epidemiological information. The expected result is Negative.  Fact Sheet for Patients: SugarRoll.be  Fact Sheet for Healthcare Providers: https://www.woods-mathews.com/  This test is not yet approved or cleared by the [place] FDA and  has been authorized for detection and/or diagnosis of SARS-CoV-2 by FDA under an Emergency Use Authorization (EUA). This EUA will remain  in effect (meaning this test can be used) for the duration of the COVID-19 declaration under Se ction 564(b)(1) of the Act, 21 U.S.C. section 360bbb-3(b)(1), unless the authorization is terminated or revoked sooner.  Performed at [facility], [place] [address] [place], [place] [address]      Labs: Basic Metabolic Panel: Recent Labs  Lab 08/08/20 1421 08/08/20 1957 08/09/20 0635  NA 138  --  138  K 3.6  --  4.0  CL 106  --  109  CO2 28  --  23  GLUCOSE 190*  --  93  BUN 20  --  14  CREATININE 1.10  --  0.82  CALCIUM 8.4*  --  8.4*  MG  --  1.9  --    Liver Function Tests: Recent Labs  Lab 08/08/20 1421  AST 18  ALT 18  ALKPHOS 64  BILITOT 1.0  PROT 7.2  ALBUMIN 4.0   No results for input(s): LIPASE, AMYLASE in the last 168 hours. No results for input(s): AMMONIA in the last 168 hours. CBC: Recent Labs  Lab 08/08/20 1421 08/09/20 0635  WBC 8.4 8.6  NEUTROABS 7.3  --   HGB 16.4 14.0  HCT 49.1 42.8  MCV 93.9 92.8  PLT 202 209   Cardiac Enzymes: No results for input(s): CKTOTAL, CKMB, CKMBINDEX, TROPONINI in the last 168 hours. BNP: Invalid input(s): POCBNP CBG: Recent Labs  Lab 08/08/20 1310 08/08/20 2357 08/09/20 0354 08/09/20 0749  GLUCAP 214*  186* 115* 91    Time coordinating discharge:  36 minutes  Signed:  Orson Eva, DO [facility] Pager: [phone] 08/09/2020, 10:48 AM

## 2020-08-09 NOTE — Progress Notes (Signed)
  Echocardiogram 2D Echocardiogram has been performed.  Douglas Nunez 08/09/2020, 11:20 AM

## 2020-08-09 NOTE — Consult Note (Signed)
Cardiology Consultation:   Patient ID: Douglas Nunez MRN: [id number]; DOB: April 16, 1955  Admit date: 08/08/2020 Date of Consult: 08/09/2020  PCP:  Lemmie Evens, MD   [place] [facility] Cardiologist:  Dr Gwenlyn Found   Patient Profile:   Douglas Nunez is a 65 y.o. male with a hx of DM who is being seen 08/09/2020 for the evaluation of syncope at the request of Dr Tat.  History of Present Illness:   Douglas Nunez 65 yo male history of DM, HTN, CAD, severe insect bite/sting allergy with prior anaphylaxis admitted with loss of consciosuness. 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 He felt he may be having an allergic reaction and walked to his truck to get benadryl and his epi pen. EMS reported hypotensive on evaluation 88/50 HR 120, significant improvement with IVFs.  From ER notes patient was found by bystander nonresponsive, reportedly without palpable pulse. Received CPR by bystander who also gave him epi pen injection. Patient does not believe he had an insect bite   ER vitals: p 83 bp 122/82  K 3.6 Cr 1.10 BUN 20 WBC 8.4 Hgb 16.4 Plt 202 Lactic acid 2.2-->1.1 Trop 28-->77-->100-->46 COVID neg  CXR no acute process CT head no acute process EKG  NSR Echo pending  2018 echo: LVE 60-65%, no WMAs 2020 coronary CTA: probable significant D1 disease, nonobstructive disease LAD    Past Medical History:  Diagnosis Date  . Allergy   . Anaphylaxis due to hymenoptera venom   . Cataract 2016, 2017   bilateral extractions with lens implant bilaterally  . Compression fracture of thoracic vertebra (HCC) T11    seen on CT  . Degenerative joint disease (DJD) of lumbar spine   . Diabetes mellitus without complication ([place])   . GERD (gastroesophageal reflux disease)   . Hx of adenomatous colonic polyps 10/07/2018  . Hypertension   . 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 Skin cancer    several places removed, and one Moths surgery  on right temple  . Vasovagal syncope 2018   associated with diarrhea, hyperglycemia    Past Surgical History:  Procedure Laterality Date  . APPENDECTOMY     age 69  . CATARACT EXTRACTION Bilateral    and lens implanted  . KNEE SURGERY Left   . LASIK Bilateral   . MOHS SURGERY Right    temple  . RETINAL DETACHMENT SURGERY Right   . SKIN BIOPSY     multiple skin cancers removed     Inpatient Medications: Scheduled Meds: . amLODipine  5 mg Oral q morning  . aspirin EC  81 mg Oral Daily  . empagliflozin  25 mg Oral Daily  . enoxaparin (LOVENOX) injection  40 mg Subcutaneous Q24H  . insulin aspart  0-15 Units Subcutaneous Q6H  . insulin glargine  10 Units Subcutaneous Daily  . loratadine  10 mg Oral BID  . losartan  25 mg Oral Daily  . pantoprazole  40 mg Oral BID AC   Continuous Infusions:  PRN Meds: acetaminophen **OR** acetaminophen, ondansetron **OR** ondansetron (ZOFRAN) IV, polyethylene glycol  Allergies:    Allergies  Allergen Reactions  . Bee Venom Anaphylaxis  . Trulicity [Dulaglutide] Other (See Comments)    Belching, stomach cramps, diarrhea  . Victoza [Liraglutide] Nausea Only  . Nsaids Itching and Rash    Social History:   Social History   Socioeconomic History  . Marital status: Married    Spouse name:  Not on file  . Number of children: 2  . Years of education: Not on file  . Highest education level: Not on file  Occupational History  . Occupation: Research scientist (life sciences): [employer]    Comment: Architect  Tobacco Use  . Smoking status: Never Smoker  . Smokeless tobacco: Never Used  Vaping Use  . Vaping Use: Never used  Substance and Sexual Activity  . Alcohol use: Yes    Alcohol/week: 0.0 standard drinks    Comment: occ  . Drug use: No  . Sexual activity: Not on file  Other Topics Concern  . Not on file  Social History Narrative   Married to [place]   Never smoker   Rare EtOH   No drugs   Social  Determinants of Radio broadcast assistant Strain: Not on file  Food Insecurity: Not on file  Transportation Needs: Not on file  Physical Activity: Not on file  Stress: Not on file  Social Connections: Not on file  Intimate Partner Violence: Not on file    Family History:    Family History  Problem Relation Age of Onset  . Diabetes Father   . Heart attack Father        multiple   . Lung cancer Mother        light smoker  . Cancer - Colon Neg Hx   . Esophageal cancer Neg Hx   . Rectal cancer Neg Hx      ROS:  Please see the history of present illness.  All other ROS reviewed and negative.     Physical Exam/Data:   Vitals:   08/08/20 2050 08/08/20 2352 08/09/20 0353 08/09/20 0752  BP: 125/68 105/65 118/72 115/72  Pulse: 77 76 71 66  Resp: 20 18 18 16   Temp: 98.8 F (37.1 C) 98.9 F (37.2 C) 98.4 F (36.9 C) 98.2 F (36.8 C)  TempSrc: Oral Oral  Oral  SpO2: 100% 95% 99% 99%  Weight:      Height:        Intake/Output Summary (Last 24 hours) at 08/09/2020 0810 Last data filed at 08/08/2020 2116 Gross per 24 hour  Intake 3180 ml  Output --  Net 3180 ml   Last 3 Weights 08/08/2020 12/10/2019 11/17/2018  Weight (lbs) 186 lb 186 lb 12.8 oz 187 lb 6.4 oz  Weight (kg) 84.369 kg 84.732 kg 85.004 kg     Body mass index is 27.07 kg/m.  General:  Well nourished, well developed, in no acute distress HEENT: normal Lymph: no adenopathy Neck: no JVD Endocrine:  No thryomegaly Vascular: No carotid bruits; FA pulses 2+ bilaterally without bruits  Cardiac:  normal S1, S2; RRR; no murmur  Lungs:  clear to auscultation bilaterally, no wheezing, rhonchi or rales  Abd: soft, nontender, no hepatomegaly  Ext: no edema Musculoskeletal:  No deformities, BUE and BLE strength normal and equal Skin: warm and dry  Neuro:  CNs 2-12 intact, no focal abnormalities noted Psych:  Normal affect     Laboratory Data:  High Sensitivity Troponin:   Recent Labs  Lab 08/08/20 1421  08/08/20 1607 08/08/20 1957 08/09/20 0035  TROPONINIHS 28* 77* 100* 46*     Chemistry Recent Labs  Lab 08/08/20 1421 08/09/20 0635  NA 138 138  K 3.6 4.0  CL 106 109  CO2 28 23  GLUCOSE 190* 93  BUN 20 14  CREATININE 1.10 0.82  CALCIUM 8.4* 8.4*  GFRNONAA >60 >  60  ANIONGAP 4* 6    Recent Labs  Lab 08/08/20 1421  PROT 7.2  ALBUMIN 4.0  AST 18  ALT 18  ALKPHOS 64  BILITOT 1.0   Hematology Recent Labs  Lab 08/08/20 1421 08/09/20 0635  WBC 8.4 8.6  RBC 5.23 4.61  HGB 16.4 14.0  HCT 49.1 42.8  MCV 93.9 92.8  MCH 31.4 30.4  MCHC 33.4 32.7  RDW 12.4 12.7  PLT 202 209   BNPNo results for input(s): BNP, PROBNP in the last 168 hours.  DDimer No results for input(s): DDIMER in the last 168 hours.   Radiology/Studies:  CT HEAD WO CONTRAST  Result Date: 08/08/2020 CLINICAL DATA:  Unresponsive, temporary loss of vision. EXAM: CT HEAD WITHOUT CONTRAST TECHNIQUE: Contiguous axial images were obtained from the base of the skull through the vertex without intravenous contrast. COMPARISON:  None. FINDINGS: Brain: Mild cerebral atrophy. No acute intracranial abnormality. Specifically, no hemorrhage, hydrocephalus, mass lesion, acute infarction, or significant intracranial injury. Vascular: No hyperdense vessel or unexpected calcification. Skull: No acute calvarial abnormality. Sinuses/Orbits: No acute findings Other: None IMPRESSION: No acute intracranial abnormality. Electronically Signed   By: Rolm Baptise M.D.   On: 08/08/2020 19:20   DG Chest Port 1 View  Result Date: 08/08/2020 CLINICAL DATA:  Syncopal episode today EXAM: PORTABLE CHEST 1 VIEW COMPARISON:  09/07/2019 chest radiograph. FINDINGS: Stable cardiomediastinal silhouette with top-normal heart size. No pneumothorax. No pleural effusion. Lungs appear clear, with no acute consolidative airspace disease and no pulmonary edema. IMPRESSION: No active disease. Electronically Signed   By: Ilona Sorrel M.D.   On: 08/08/2020  14:37     Assessment and Plan:   1. Syncope - probable vasovagal syncope in setting of hypovolemia/orthostasis. Hgb 16.4-->14 with IVFs, Cr 1.1 --> 0.8 and BUN 20 --> 14. Orthostatics after already given IVFs HR increases 81 to 102. All suggest hypovolemic on admission. Had been outodoors at [place] all weekend prior, episode occurred after standing up in very hot closet.  - tele without significant arrhythmias - f/u echo, if benign no further inpatient workup    2. Elevated troponin - likely secondary to hypotension, systemic hypoperfusion reflected by his transient lactic acidosis - no acute findings on EKG - f/u echo, no plans for ischemic testing at this time  Likely discharge later today pending echo   For questions or updates, please contact [facility] Please consult [email or web address] for contact info under    Signed, Carlyle Dolly, MD  08/09/2020 8:10 AM

## 2020-08-09 NOTE — Plan of Care (Signed)

## 2020-08-10 LAB — HEMOGLOBIN A1C
Hgb A1c MFr Bld: 7.5 % — ABNORMAL HIGH (ref 4.8–5.6)
Mean Plasma Glucose: 169 mg/dL

## 2020-09-08 ENCOUNTER — Ambulatory Visit: Payer: Medicare Other | Admitting: Student

## 2021-02-11 ENCOUNTER — Other Ambulatory Visit: Payer: Self-pay | Admitting: Cardiovascular Disease

## 2021-04-26 ENCOUNTER — Other Ambulatory Visit: Payer: Self-pay | Admitting: Cardiovascular Disease

## 2021-09-24 ENCOUNTER — Other Ambulatory Visit (HOSPITAL_COMMUNITY)
Admission: RE | Admit: 2021-09-24 | Discharge: 2021-09-24 | Disposition: A | Discharge status: Home / Self Care | Payer: Medicare Other | Source: Ambulatory Visit | Attending: Family Medicine | Admitting: Family Medicine

## 2021-09-24 DIAGNOSIS — D126 Benign neoplasm of colon, unspecified: Secondary | ICD-10-CM | POA: Insufficient documentation

## 2021-09-24 DIAGNOSIS — E785 Hyperlipidemia, unspecified: Secondary | ICD-10-CM | POA: Insufficient documentation

## 2021-09-24 DIAGNOSIS — E6609 Other obesity due to excess calories: Secondary | ICD-10-CM | POA: Diagnosis not present

## 2021-09-24 DIAGNOSIS — E1165 Type 2 diabetes mellitus with hyperglycemia: Secondary | ICD-10-CM | POA: Diagnosis not present

## 2021-09-24 DIAGNOSIS — E1142 Type 2 diabetes mellitus with diabetic polyneuropathy: Secondary | ICD-10-CM | POA: Diagnosis present

## 2021-09-24 LAB — CBC
HCT: 49.5 % (ref 39.0–52.0)
Hemoglobin: 16.4 g/dL (ref 13.0–17.0)
MCH: 30.1 pg (ref 26.0–34.0)
MCHC: 33.1 g/dL (ref 30.0–36.0)
MCV: 91 fL (ref 80.0–100.0)
Platelets: 192 10*3/uL (ref 150–400)
RBC: 5.44 MIL/uL (ref 4.22–5.81)
RDW: 12.8 % (ref 11.5–15.5)
WBC: 4.5 10*3/uL (ref 4.0–10.5)
nRBC: 0 % (ref 0.0–0.2)

## 2021-09-24 LAB — COMPREHENSIVE METABOLIC PANEL
ALT: 25 U/L (ref 0–44)
AST: 18 U/L (ref 15–41)
Albumin: 4.2 g/dL (ref 3.5–5.0)
Alkaline Phosphatase: 57 U/L (ref 38–126)
Anion gap: 8 (ref 5–15)
BUN: 19 mg/dL (ref 8–23)
CO2: 28 mmol/L (ref 22–32)
Calcium: 9.2 mg/dL (ref 8.9–10.3)
Chloride: 105 mmol/L (ref 98–111)
Creatinine, Ser: 0.92 mg/dL (ref 0.61–1.24)
GFR, Estimated: >60 mL/min (ref >60)
Glucose, Bld: 148 mg/dL — ABNORMAL HIGH (ref 70–99)
Potassium: 4.3 mmol/L (ref 3.5–5.1)
Sodium: 141 mmol/L (ref 135–145)
Total Bilirubin: 1.1 mg/dL (ref 0.3–1.2)
Total Protein: 7.6 g/dL (ref 6.5–8.1)

## 2021-09-24 LAB — LIPID PANEL
Cholesterol: 95 mg/dL (ref 0–200)
HDL: 34 mg/dL — ABNORMAL LOW (ref >40)
LDL Cholesterol: 44 mg/dL (ref 0–99)
Total CHOL/HDL Ratio: 2.8 RATIO
Triglycerides: 83 mg/dL (ref <150)
VLDL: 17 mg/dL (ref 0–40)

## 2021-09-24 LAB — HEMOGLOBIN A1C
Hgb A1c MFr Bld: 8.1 % — ABNORMAL HIGH (ref 4.8–5.6)
Mean Plasma Glucose: 185.77 mg/dL

## 2021-09-24 LAB — PSA: Prostatic Specific Antigen: 1.82 ng/mL (ref 0.00–4.00)

## 2021-09-24 LAB — VITAMIN D 25 HYDROXY (VIT D DEFICIENCY, FRACTURES): Vit D, 25-Hydroxy: 19.84 ng/mL — ABNORMAL LOW (ref 30–100)

## 2021-09-25 LAB — VITAMIN B12: Vitamin B-12: 198 pg/mL (ref 180–914)

## 2021-09-28 IMAGING — DX DG RIBS W/ CHEST 3+V*R*
4 series · 5 of 5 positions shown · non-contrast
Comparison: None.

CLINICAL DATA: Patient fell [REDACTED] and head right-side. Right rib
pain.

EXAM:
RIGHT RIBS AND CHEST - 3+ VIEW

[chest pa]
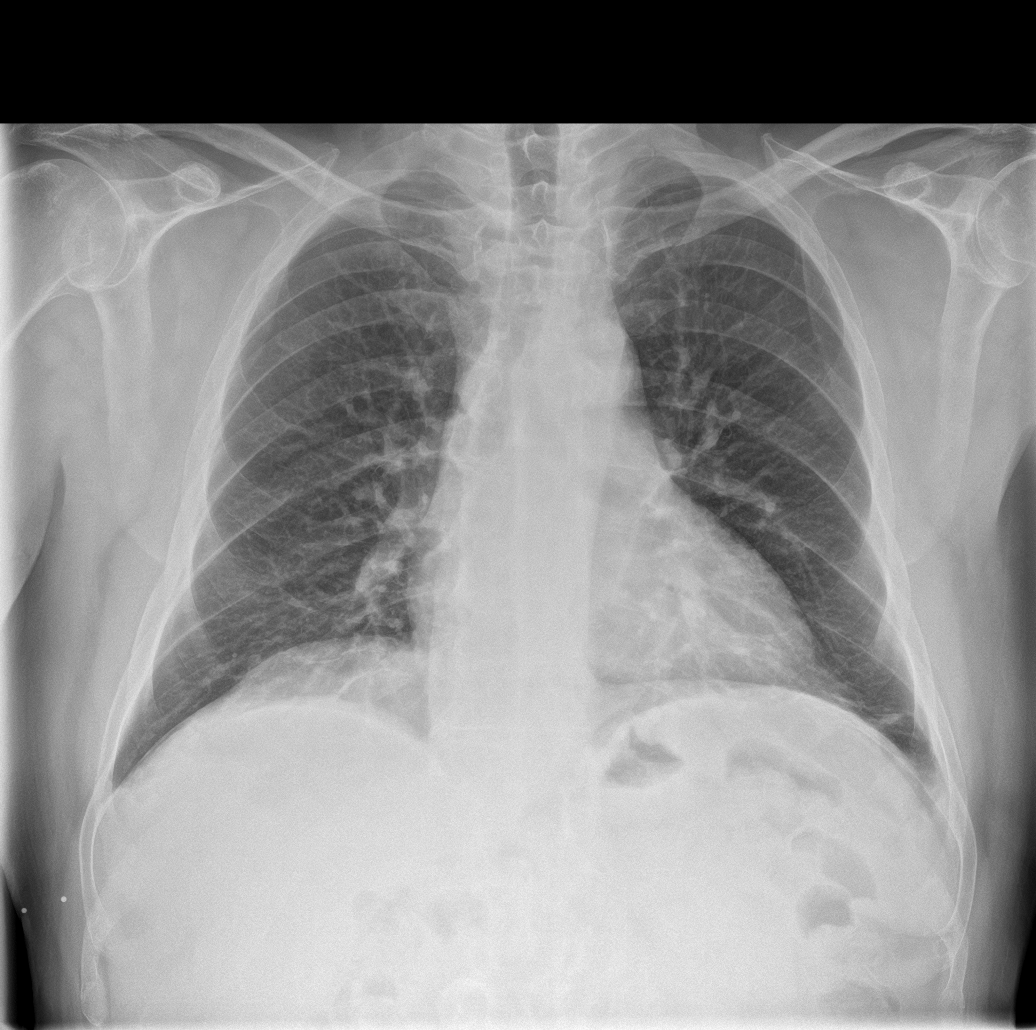

[rib pa]
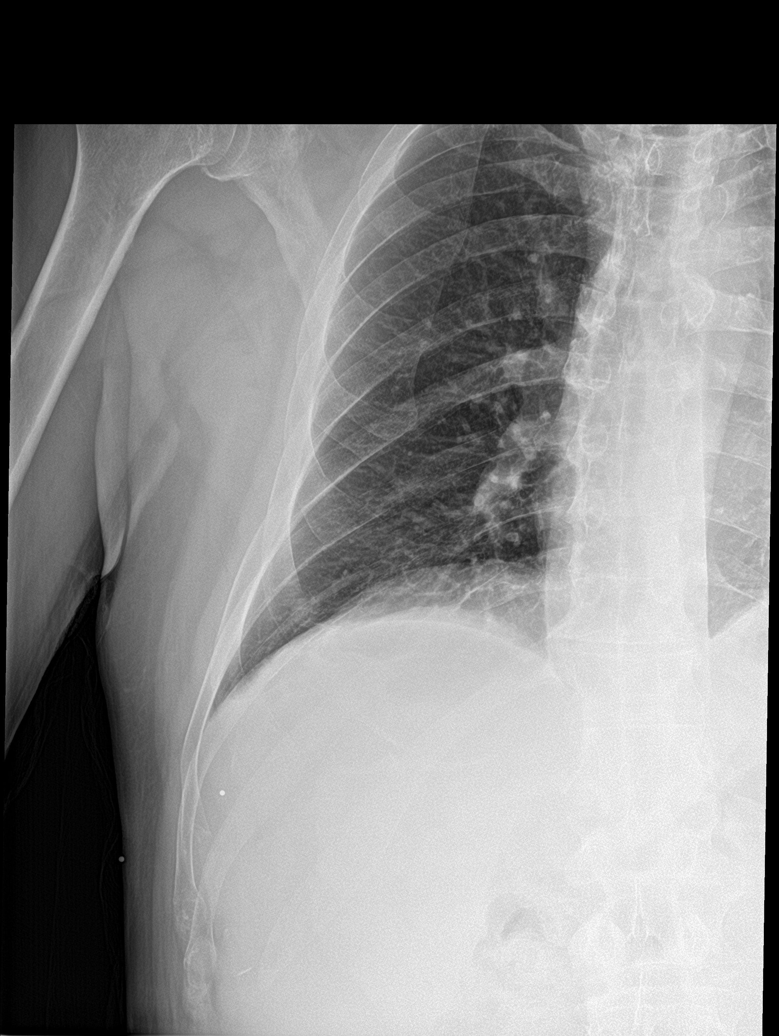

[rib pa obl (1 of 2)]
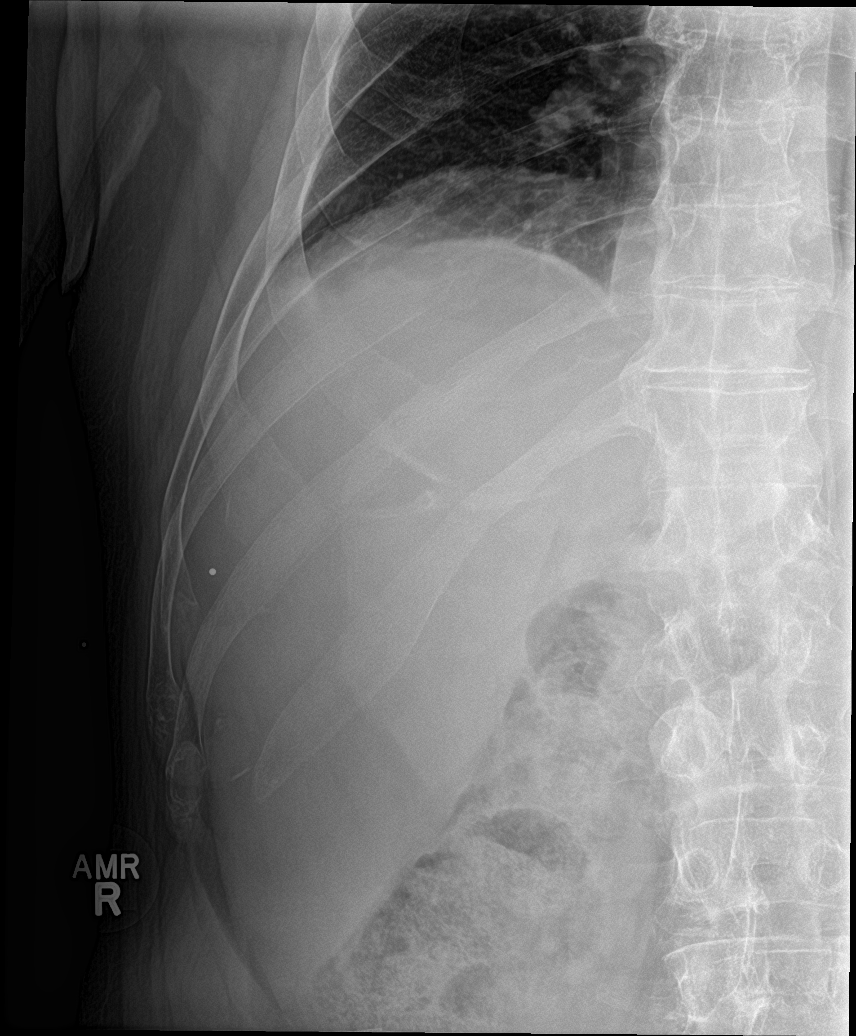

[Series 4: rib pa obl · 0.13mm/px · 2 of 2 slices shown (2 of 2)]
[im 1/2]
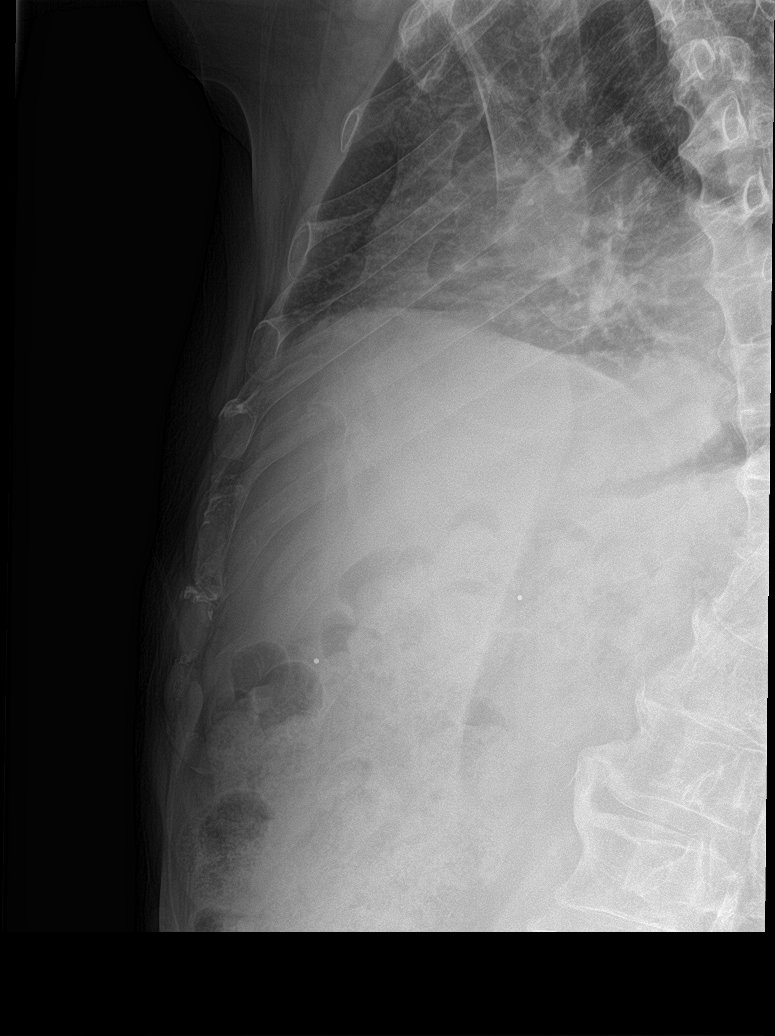
[im 2/2]
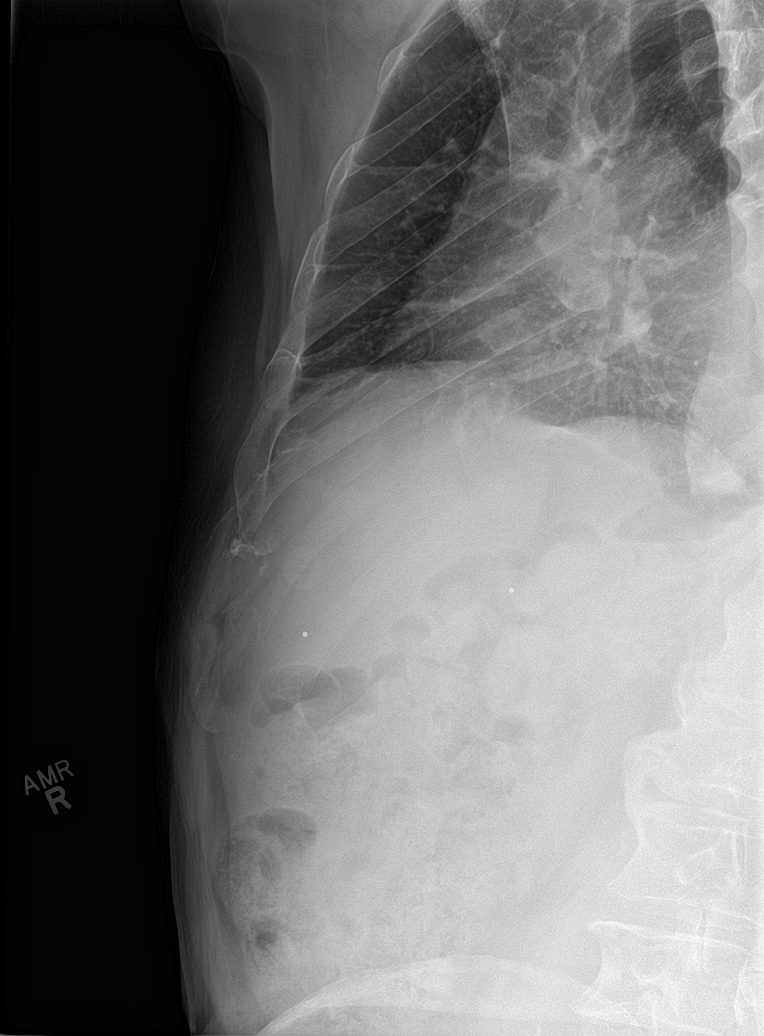

[5 of 5 positions shown; findings below may reference images not displayed]

FINDINGS: The lungs are clear without focal pneumonia, edema, pneumothorax or
pleural effusion. The cardiopericardial silhouette is within normal
limits for size. Oblique views of the right ribs were obtained with
a Radio-opaque marker placed on the skin at the site of patient
concern. No evidence for an acute displaced right-sided rib
fracture.
IMPRESSION: Negative.

## 2022-02-06 ENCOUNTER — Telehealth: Payer: Self-pay | Admitting: Cardiovascular Disease

## 2022-02-06 NOTE — Telephone Encounter (Signed)
Returned call to patient and advised him of PharmD's advice. Patient will follow up at the beginning of the year.   Advised patient to call back to office with any issues, questions, or concerns. Patient verbalized understanding.

## 2022-02-06 NOTE — Telephone Encounter (Signed)
Ok to change to [place]. We will take care of getting him switched over after Jan 1.

## 2022-02-06 NOTE — Telephone Encounter (Signed)
Pt c/o medication issue:  1. Name of Medication: PRALUENT 150 MG/ML SOAJ   2. How are you currently taking this medication (dosage and times per day)?   3. Are you having a reaction (difficulty breathing--STAT)?   4. What is your medication issue? Patient states his insurance company is going to stop covering praluent as of 03/11/22.  They sent him a letter stating they will cover Repatha.  He wants to know if that medication would be okay to use. Please advise

## 2022-02-06 NOTE — Telephone Encounter (Signed)
Returned call to patient who states that his insurance is no longer going to cover the [place]- patient states that his insurance will cover the [place]. Patient would like to know if it would be okay to switch to repatha in the new year. Patient states that he has enough Praulent to last until Feb of next year. Advised patient I would forward to PharmD for review and advice. Patient verbalized understanding.

## 2022-04-08 ENCOUNTER — Other Ambulatory Visit (HOSPITAL_COMMUNITY): Payer: Self-pay

## 2022-04-08 ENCOUNTER — Telehealth: Payer: Self-pay

## 2022-04-08 MED ORDER — REPATHA SURECLICK 140 MG/ML ~~LOC~~ SOAJ: 1.0000 | SUBCUTANEOUS | 3 refills | Status: DC

## 2022-04-08 NOTE — Telephone Encounter (Signed)
Pharmacy Patient Advocate Encounter  Prior Authorization for REPATHA 140 MG/ML INJ has been approved.    Effective dates: 03/11/22 through 04/08/23   Received notification from [place] that prior authorization for REPATHA 140 MG/ML INJ is needed.    PA submitted on 04/08/22 Key [id number] Status is pending  Karie Soda, [place] Patient Advocate Specialist Direct Number: [phone] Fax: [phone]

## 2022-04-08 NOTE — Telephone Encounter (Signed)
Pt aware Praluent changing to Repatha d/t formulary change, rx sent in.

## 2022-04-08 NOTE — Addendum Note (Signed)
Addended by: Verdia Bolt E on: 04/08/2022 01:27 PM   Modules accepted: Orders

## 2022-10-08 LAB — HEPATIC FUNCTION PANEL
ALT: 23 U/L (ref 10–40)
AST: 20 (ref 14–40)
Alkaline Phosphatase: 64 (ref 25–125)

## 2022-10-08 LAB — BASIC METABOLIC PANEL
Chloride: 107 (ref 99–108)
Creatinine: 0.9 (ref 0.6–1.3)
Glucose: 85
Potassium: 4.1 mEq/L (ref 3.5–5.1)
Sodium: 141 (ref 137–147)

## 2022-10-08 LAB — COMPREHENSIVE METABOLIC PANEL
Calcium: 9.6 (ref 8.7–10.7)
eGFR: 91

## 2022-10-08 LAB — LIPID PANEL
Cholesterol: 86 (ref 0–200)
HDL: 38 (ref 35–70)
LDL Cholesterol: 33
Triglycerides: 74 (ref 40–160)

## 2022-10-08 LAB — CBC AND DIFFERENTIAL
HCT: 47 (ref 41–53)
Hemoglobin: 16 (ref 13.5–17.5)
Platelets: 221 10*3/uL (ref 150–400)
WBC: 5.1

## 2022-10-08 LAB — PSA: PSA: 1.49

## 2022-10-08 LAB — HEMOGLOBIN A1C: Hemoglobin A1C: 7.7

## 2022-11-02 ENCOUNTER — Encounter (HOSPITAL_COMMUNITY): Payer: Self-pay

## 2022-11-02 ENCOUNTER — Other Ambulatory Visit: Payer: Self-pay

## 2022-11-02 ENCOUNTER — Emergency Department (HOSPITAL_COMMUNITY)
Admission: EM | Admit: 2022-11-02 | Discharge: 2022-11-02 | Disposition: A | Discharge status: Home / Self Care | Payer: Medicare Other | Attending: Emergency Medicine | Admitting: Emergency Medicine

## 2022-11-02 DIAGNOSIS — E119 Type 2 diabetes mellitus without complications: Secondary | ICD-10-CM | POA: Diagnosis not present

## 2022-11-02 DIAGNOSIS — Z7982 Long term (current) use of aspirin: Secondary | ICD-10-CM | POA: Diagnosis not present

## 2022-11-02 DIAGNOSIS — I10 Essential (primary) hypertension: Secondary | ICD-10-CM | POA: Diagnosis not present

## 2022-11-02 DIAGNOSIS — Z79899 Other long term (current) drug therapy: Secondary | ICD-10-CM | POA: Insufficient documentation

## 2022-11-02 DIAGNOSIS — Z794 Long term (current) use of insulin: Secondary | ICD-10-CM | POA: Insufficient documentation

## 2022-11-02 DIAGNOSIS — T7840XA Allergy, unspecified, initial encounter: Secondary | ICD-10-CM

## 2022-11-02 DIAGNOSIS — I2510 Atherosclerotic heart disease of native coronary artery without angina pectoris: Secondary | ICD-10-CM | POA: Insufficient documentation

## 2022-11-02 DIAGNOSIS — T63441A Toxic effect of venom of bees, accidental (unintentional), initial encounter: Secondary | ICD-10-CM | POA: Diagnosis not present

## 2022-11-02 LAB — CBC WITH DIFFERENTIAL/PLATELET
Abs Immature Granulocytes: 0.04 10*3/uL (ref 0.00–0.07)
Basophils Absolute: 0.1 10*3/uL (ref 0.0–0.1)
Basophils Relative: 1 %
Eosinophils Absolute: 0.1 10*3/uL (ref 0.0–0.5)
Eosinophils Relative: 1 %
HCT: 46.1 % (ref 39.0–52.0)
Hemoglobin: 15.5 g/dL (ref 13.0–17.0)
Immature Granulocytes: 1 %
Lymphocytes Relative: 43 %
Lymphs Abs: 3.5 10*3/uL (ref 0.7–4.0)
MCH: 30.6 pg (ref 26.0–34.0)
MCHC: 33.6 g/dL (ref 30.0–36.0)
MCV: 91.1 fL (ref 80.0–100.0)
Monocytes Absolute: 0.3 10*3/uL (ref 0.1–1.0)
Monocytes Relative: 4 %
Neutro Abs: 4.1 10*3/uL (ref 1.7–7.7)
Neutrophils Relative %: 50 %
Platelets: 251 10*3/uL (ref 150–400)
RBC: 5.06 MIL/uL (ref 4.22–5.81)
RDW: 12.7 % (ref 11.5–15.5)
WBC: 8.1 10*3/uL (ref 4.0–10.5)
nRBC: 0 % (ref 0.0–0.2)

## 2022-11-02 LAB — BASIC METABOLIC PANEL
Anion gap: 12 (ref 5–15)
BUN: 19 mg/dL (ref 8–23)
CO2: 19 mmol/L — ABNORMAL LOW (ref 22–32)
Calcium: 8.9 mg/dL (ref 8.9–10.3)
Chloride: 109 mmol/L (ref 98–111)
Creatinine, Ser: 1.21 mg/dL (ref 0.61–1.24)
GFR, Estimated: >60 mL/min (ref >60)
Glucose, Bld: 211 mg/dL — ABNORMAL HIGH (ref 70–99)
Potassium: 3.4 mmol/L — ABNORMAL LOW (ref 3.5–5.1)
Sodium: 140 mmol/L (ref 135–145)

## 2022-11-02 MED ORDER — DEXAMETHASONE SODIUM PHOSPHATE 10 MG/ML IJ SOLN
10.0000 mg | Freq: Once | INTRAMUSCULAR | Status: AC
  Administered 2022-11-02: 10 mg via INTRAVENOUS
  Filled 2022-11-02: qty 1

## 2022-11-02 MED ORDER — FAMOTIDINE IN NACL 20-0.9 MG/50ML-% IV SOLN
20.0000 mg | Freq: Once | INTRAVENOUS | Status: AC
  Administered 2022-11-02: 20 mg via INTRAVENOUS
  Filled 2022-11-02: qty 50

## 2022-11-02 MED ORDER — EPINEPHRINE 0.3 MG/0.3ML IJ SOAJ
INTRAMUSCULAR | Status: AC
  Filled 2022-11-02: qty 0.3

## 2022-11-02 MED ORDER — DIPHENHYDRAMINE HCL 50 MG/ML IJ SOLN
25.0000 mg | Freq: Once | INTRAMUSCULAR | Status: AC
  Administered 2022-11-02: 25 mg via INTRAVENOUS
  Filled 2022-11-02: qty 1

## 2022-11-02 MED ORDER — SODIUM CHLORIDE 0.9 % IV BOLUS
1000.0000 mL | Freq: Once | INTRAVENOUS | Status: AC
  Administered 2022-11-02: 1000 mL via INTRAVENOUS

## 2022-11-02 NOTE — ED Triage Notes (Addendum)
POV/ bee x2 2/ 15 mins PTA/ took benadryl and epi pen, needle does not appear to have retracted into skin/ pt is flushed/ hx of anaphylaxis from bee stings/ pt A&Ox4/ pt drove to hospital

## 2022-11-02 NOTE — ED Provider Notes (Signed)
[place] EMERGENCY DEPARTMENT AT [facility] Provider Note   CSN: [id number] Arrival date & time: 11/02/22  1409     History  Chief Complaint  Patient presents with   Allergic Reaction    Douglas Nunez is a 67 y.o. male.  The history is provided by the patient and medical records. No language interpreter was used.  Allergic Reaction    67 year old male with significant history of anaphylactic reaction to bee sting, diabetes, GERD, hypertension, CAD presented to ED with concerns of bee stings.  Patient report approximately 30 minutes ago he was stung by a bee twice and he quickly took some oral Benadryl and did attempt to use his EpiPen and drove himself here.  He mention he has had severe allergic reaction to bee stings in the past which concerns him.  He complains of heart racing, feeling flushed, and anxious.  He does not complain of any tongue swelling, wheezing, abdominal cramping.  He does endorse some lightheadedness and dizziness but did not pass out.  Home Medications Prior to Admission medications   Medication Sig Start Date End Date Taking? Authorizing Provider  amLODipine (NORVASC) 5 MG tablet Take 5 mg every morning by mouth.     [provider]  aspirin 81 MG tablet Take 81 mg by mouth daily.    [provider]  empagliflozin (JARDIANCE) 25 MG TABS tablet Take 25 mg by mouth daily.     [provider]  EPINEPHrine 0.3 mg/0.3 mL IJ SOAJ injection Inject 0.3 mg into the muscle once.    [provider]  Evolocumab (REPATHA SURECLICK) 140 MG/ML SOAJ Inject 140 mg into the skin every 14 (fourteen) days. 04/08/22   Runell Gess, MD  Insulin Glargine [facility] KWIKPEN) 100 UNIT/ML Inject 18 Units into the skin daily. 07/31/20   [provider]  loratadine (CLARITIN) 10 MG tablet Take 10 mg by mouth 2 (two) times daily.    [provider]  losartan (COZAAR) 25 MG tablet Take 25 mg by mouth daily. 06/11/20    [provider]  [facility] VERIO test strip 3 (three) times daily. 11/09/19   [provider]  pantoprazole (PROTONIX) 40 MG tablet Take 40 mg by mouth 2 (two) times daily before a meal.     [provider]      Allergies    Bee venom, Trulicity [dulaglutide], Victoza [liraglutide], and Nsaids    Review of Systems   Review of Systems  All other systems reviewed and are negative.   Physical Exam Updated Vital Signs BP (!) 155/86 (BP Location: Left Arm)   Pulse (!) 108   Temp 98.3 F (36.8 C) (Oral)   Resp 20   Wt 87.1 kg   SpO2 97%   BMI 27.95 kg/m  Physical Exam Vitals and nursing note reviewed.  Constitutional:      General: He is not in acute distress.    Appearance: He is well-developed.     Comments: Patient appears slightly anxious but nontoxic.  Skin is flushed.  HENT:     Head: Atraumatic.     Mouth/Throat:     Mouth: Mucous membranes are moist.     Pharynx: Oropharynx is clear.  Eyes:     Conjunctiva/sclera: Conjunctivae normal.  Neck:     Comments: No stridor no trismus Cardiovascular:     Rate and Rhythm: Tachycardia present.  Pulmonary:     Effort: Pulmonary effort is normal.     Breath sounds: Normal  breath sounds. No wheezing or rhonchi.  Abdominal:     Palpations: Abdomen is soft.     Tenderness: There is no abdominal tenderness.  Musculoskeletal:     Cervical back: Normal range of motion and neck supple.  Skin:    General: Skin is warm.     Findings: No rash.  Neurological:     Mental Status: He is alert. Mental status is at baseline.    ED Results / Procedures / Treatments   Labs (all labs ordered are listed, but only abnormal results are displayed) Labs Reviewed  BASIC METABOLIC PANEL - Abnormal; Notable for the following components:      Result Value   Potassium 3.4 (*)    CO2 19 (*)    Glucose, Bld 211 (*)    All other components within normal limits  CBC WITH DIFFERENTIAL/PLATELET    EKG None ED ECG  REPORT   Date: 11/02/2022 14:13:45  Rate: 155  Rhythm: supraventricular tachycardia (SVT)  QRS Axis: normal  Intervals: normal  ST/T Wave abnormalities: nonspecific T wave changes  Conduction Disutrbances:none  Narrative Interpretation:   Old EKG Reviewed: changes noted  I have personally reviewed the EKG tracing and agree with the computerized printout as noted.    Radiology No results found.  Procedures Procedures    Medications Ordered in ED Medications  EPINEPHrine (EPI-PEN) 0.3 mg/0.3 mL injection (has no administration in time range)  diphenhydrAMINE (BENADRYL) injection 25 mg (has no administration in time range)  famotidine (PEPCID) IVPB 20 mg premix (has no administration in time range)  dexamethasone (DECADRON) injection 10 mg (has no administration in time range)  sodium chloride 0.9 % bolus 1,000 mL (has no administration in time range)    ED Course/ Medical Decision Making/ A&P                                 Medical Decision Making Amount and/or Complexity of Data Reviewed Labs: ordered. ECG/medicine tests: ordered.  Risk Prescription drug management.   BP (!) 155/86 (BP Location: Left Arm)   Pulse (!) 108   Temp 98.3 F (36.8 C) (Oral)   Resp 20   Wt 87.1 kg   SpO2 97%   BMI 27.95 kg/m   58:95 PM  67 year old male with significant history of anaphylactic reaction to bee sting, diabetes, GERD, hypertension, CAD presented to ED with concerns of bee stings.  Patient report approximately 30 minutes ago he was stung by a bee twice and he quickly took some oral Benadryl and did attempt to use his EpiPen and drove himself here.  He mention he has had severe allergic reaction to bee stings in the past which concerns him.  He complains of heart racing, feeling flushed, and anxious.  He does not complain of any tongue swelling, wheezing, abdominal cramping.  He does endorse some lightheadedness and dizziness but did not pass out.  On exam, patient appears  flushed, he is tachycardic, lungs otherwise clear, no tongue swelling, normal phonation, no obvious urticaria rash.  His symptom is concerning for allergic action to bee sting.  This is being in the ED, he reports feeling little bit better.  Will give Decadron, Pepcid, Benadryl, and IV fluid.  Will hold off on additional EpiPen at this time and will monitor patient closely.  -Labs ordered, independently viewed and interpreted by me.  Labs remarkable for CBG 211 -The patient was maintained on a cardiac  monitor.  I personally viewed and interpreted the cardiac monitored which showed an underlying rhythm of: SVT, however after IVF, and treatment HR normalized -Imaging including CXR considered but not performed as pt without SOB -This patient presents to the ED for concern of allergic reaction, this involves an extensive number of treatment options, and is a complaint that carries with it a high risk of complications and morbidity.  The differential diagnosis includes allergic reaction from bee sting, anaphylaxis, anxiety -Co morbidities that complicate the patient evaluation includes DM -Treatment includes benadryl, prednisone, IVF, pepcid -Reevaluation of the patient after these medicines showed that the patient improved -PCP office notes or outside notes reviewed -Discussion with specialist attending Dr. Estell Harpin -Escalation to admission/observation considered: patients feels much better, is comfortable with discharge, and will follow up with PCP -Prescription medication considered, patient comfortable with home medication -Social Determinant of Health considered  5:15 PM Patient has been monitored in the ED for the past 3 hours.  Is has been at least 4 hours since his epi injection.  He is feeling much better on reassessment heart rate normalized, flushing sensation of his skin has resolved, no tongue swelling no wheezing no abdominal cramping and no hypotension.  At this time patient stable to be  discharged home.  He does have EpiPen available at home.  He will continue to use his Benadryl.  Patient is then to return promptly if he has any concern.         Final Clinical Impression(s) / ED Diagnoses Final diagnoses:  Allergic reaction, initial encounter    Rx / DC Orders ED Discharge Orders     None         Fayrene Helper, PA-C 11/02/22 1717    Bethann Berkshire, MD 11/03/22 [phone]

## 2022-11-02 NOTE — Discharge Instructions (Addendum)
You have the monitor after your allergic reaction to bee sting.  Please use EpiPen next time if you experiencing symptoms.  You may continue to take your Benadryl medication at home as needed.  Return to ER if your symptoms worsen.  Please be aware with administration of prednisone during this visit, your blood sugar will likely elevated.  Monitor closely and treat as appropriate.

## 2022-11-04 ENCOUNTER — Telehealth: Payer: Self-pay | Admitting: Family Medicine

## 2022-11-04 NOTE — Telephone Encounter (Signed)
Pt's wife Darl Pikes, is a patient of Dr. Mariel Aloe. She said that during one of her visits, Dr. Abner Greenspan told her to let her know when pt's physician retired and she could help suggest another MD that could see him due to he is a little complicated. Wife calling to follow up on that conversation. Dr. Sudie Bailey retired at the end of July. Please call pt or wife [phone]) to advise.

## 2022-11-05 NOTE — Telephone Encounter (Signed)
Called spoke with pt and husband, they both stated ok with been seen by NP or PA  Just want someone who good to take care of his concerns. Pt stated she have seen Melissa and they said ok with male or male. Pt was advised we will be in touch with update.

## 2022-11-27 NOTE — Progress Notes (Unsigned)
Established patient visit   Patient: Douglas Nunez   DOB: [birth year]   67 y.o. Male  MRN: [id number] Visit Date: 11/28/2022  Today's healthcare provider: Alfredia Ferguson, PA-C   No chief complaint on file.  Subjective    HPI  ***  Medications: Outpatient Medications Prior to Visit  Medication Sig   amLODipine (NORVASC) 5 MG tablet Take 5 mg every morning by mouth.    aspirin 81 MG tablet Take 81 mg by mouth daily.   empagliflozin (JARDIANCE) 25 MG TABS tablet Take 25 mg by mouth daily.    EPINEPHrine 0.3 mg/0.3 mL IJ SOAJ injection Inject 0.3 mg into the muscle once.   Evolocumab (REPATHA SURECLICK) 140 MG/ML SOAJ Inject 140 mg into the skin every 14 (fourteen) days.   Insulin Glargine (BASAGLAR KWIKPEN) 100 UNIT/ML Inject 18 Units into the skin daily.   loratadine (CLARITIN) 10 MG tablet Take 10 mg by mouth 2 (two) times daily.   losartan (COZAAR) 25 MG tablet Take 25 mg by mouth daily.   ONETOUCH VERIO test strip 3 (three) times daily.   pantoprazole (PROTONIX) 40 MG tablet Take 40 mg by mouth 2 (two) times daily before a meal.    No facility-administered medications prior to visit.    Review of Systems    Objective    There were no vitals taken for this visit.  Physical Exam  ***  No results found for any visits on 11/28/22.  Assessment & Plan     ***  No follow-ups on file.      Alfredia Ferguson, PA-C  [facility] at [facility] [phone] (phone) [phone] (fax)  [facility]

## 2022-11-28 ENCOUNTER — Encounter: Payer: Self-pay | Admitting: Physician Assistant

## 2022-11-28 ENCOUNTER — Ambulatory Visit (INDEPENDENT_AMBULATORY_CARE_PROVIDER_SITE_OTHER): Payer: Medicare Other | Admitting: Physician Assistant

## 2022-11-28 VITALS — BP 134/72 | HR 72 | Temp 97.7°F | Resp 20 | Ht 69.0 in | Wt 193.0 lb

## 2022-11-28 DIAGNOSIS — Z794 Long term (current) use of insulin: Secondary | ICD-10-CM | POA: Diagnosis not present

## 2022-11-28 DIAGNOSIS — E1165 Type 2 diabetes mellitus with hyperglycemia: Secondary | ICD-10-CM | POA: Diagnosis not present

## 2022-11-28 DIAGNOSIS — K219 Gastro-esophageal reflux disease without esophagitis: Secondary | ICD-10-CM

## 2022-11-28 DIAGNOSIS — E782 Mixed hyperlipidemia: Secondary | ICD-10-CM

## 2022-11-28 DIAGNOSIS — Z789 Other specified health status: Secondary | ICD-10-CM | POA: Insufficient documentation

## 2022-11-28 DIAGNOSIS — I10 Essential (primary) hypertension: Secondary | ICD-10-CM

## 2022-11-28 DIAGNOSIS — Z85828 Personal history of other malignant neoplasm of skin: Secondary | ICD-10-CM

## 2022-11-28 DIAGNOSIS — E876 Hypokalemia: Secondary | ICD-10-CM

## 2022-11-28 LAB — MICROALBUMIN / CREATININE URINE RATIO
Creatinine,U: 61.8 mg/dL
Microalb Creat Ratio: 1.1 mg/g (ref 0.0–30.0)
Microalb, Ur: <0.7 mg/dL (ref 0.0–1.9)

## 2022-11-28 NOTE — Assessment & Plan Note (Addendum)
Recent A1c 7.7% 09/2022 slightly elevated due to dietary changes, but improved from 8.1% 09/2021. Currently on Basalgar 40 units daily and Jardiance 25 mg. Previous adverse reaction to Trulicity. -Encourage dietary modifications to improve glycemic control. -Endocrinology follow-up in November. Uacr ordered  Not on statin, but taking repatha , 81 mg asa F/u 6 mo pt reports foot exams through endo, will complete here as well in 6 mo.

## 2022-11-28 NOTE — Assessment & Plan Note (Signed)
History of basal cell carcinoma and melanoma.  -Continue regular dermatology follow-ups every six months.

## 2022-11-28 NOTE — Assessment & Plan Note (Signed)
Myalgias, now on repatha

## 2022-11-28 NOTE — Assessment & Plan Note (Addendum)
Managed on repatha, followed by cardiology LDL goal < 70, at goal last check 09/2022 was 33

## 2022-11-28 NOTE — Assessment & Plan Note (Addendum)
chronic, well controlled Manages with amlodipine 5 mg and losartan 25 mg.  Reviewed cmp F/u 6 mo

## 2022-11-28 NOTE — Assessment & Plan Note (Signed)
Currently manages with pantoprazole, reviewed differences between famotidine and pantoprazole. Pt can switch from ppi to h2, advised may additionally help with allergic symptoms but likely wont replace his claritin. May be enough to control gerd symptoms, but ppis typically control better. Up to patient to decide switch, did advise of the implications of long term ppi use, but also of uncontrolled gerd.

## 2022-12-02 ENCOUNTER — Ambulatory Visit: Payer: Medicare Other | Admitting: Physician Assistant

## 2022-12-04 ENCOUNTER — Other Ambulatory Visit: Payer: Self-pay | Admitting: Physician Assistant

## 2022-12-04 ENCOUNTER — Telehealth: Payer: Self-pay | Admitting: Physician Assistant

## 2022-12-04 ENCOUNTER — Encounter: Payer: Self-pay | Admitting: Physician Assistant

## 2022-12-04 DIAGNOSIS — Z91030 Bee allergy status: Secondary | ICD-10-CM

## 2022-12-04 DIAGNOSIS — T782XXA Anaphylactic shock, unspecified, initial encounter: Secondary | ICD-10-CM

## 2022-12-04 NOTE — Telephone Encounter (Signed)
Per care everywhere he had a 6 month follow -up on 07/04/22- unsure why we didn't receive that OV note I'll reach out to [facility].

## 2022-12-04 NOTE — Telephone Encounter (Signed)
I got his last endocrinology note; its from 12/2021. Is he still following on a regular basis? Does he need a new referral?

## 2022-12-10 ENCOUNTER — Other Ambulatory Visit: Payer: Self-pay

## 2022-12-10 ENCOUNTER — Ambulatory Visit (INDEPENDENT_AMBULATORY_CARE_PROVIDER_SITE_OTHER): Payer: Medicare Other | Admitting: Allergy and Immunology

## 2022-12-10 ENCOUNTER — Encounter: Payer: Self-pay | Admitting: Allergy and Immunology

## 2022-12-10 VITALS — BP 142/80 | HR 70 | Temp 98.0°F | Ht 68.0 in | Wt 190.9 lb

## 2022-12-10 DIAGNOSIS — T782XXS Anaphylactic shock, unspecified, sequela: Secondary | ICD-10-CM | POA: Diagnosis not present

## 2022-12-10 DIAGNOSIS — D8940 Mast cell activation, unspecified: Secondary | ICD-10-CM | POA: Diagnosis not present

## 2022-12-10 DIAGNOSIS — T63481S Toxic effect of venom of other arthropod, accidental (unintentional), sequela: Secondary | ICD-10-CM

## 2022-12-10 NOTE — Progress Notes (Unsigned)
- [place] - [place] - [place] -    Dear Sudie Bailey,  Thank you for referring Rafael Bihari to the [facility] on 12/10/2022.   Below is a summation of this patient's evaluation and recommendations.  Thank you for your referral. I will keep you informed about this patient's response to treatment.   If you have any questions please do not hesitate to contact me.   Sincerely,  Jessica Priest, MD Allergy / Immunology [facility]   ______________________________________________________________________    NEW PATIENT NOTE  Referring Provider: Gareth Morgan, MD Primary Provider: Alfredia Ferguson, PA-C Date of office visit: 12/10/2022    Subjective:   Chief Complaint:  Douglas Nunez (DOB: 1955/07/06) is a 67 y.o. male who presents to the clinic on 12/10/2022 with a chief complaint of Allergic Rhinitis  and Establish Care .     HPI: Ashby presents to this clinic in evaluation of hymenoptera venom hypersensitivity.  I had seen him in this clinic approximately 7 years ago for a similar issue.  He has developed anaphylaxis with loss of consciousness and hypotension multiple times after exposure to hymenoptera venom.  His most recent exposure occurred in August 2024 and he was stung by some type of unidentifiable insect while mowing and he passed out and became extremely flushed and was evaluated and treated in the emergency room.  He did use his epinephrine and took Benadryl before a trip to his emergency room.  And besides for having problems with hymenoptera venom exposure over the course of the past several years, he also had an unexplained episode of anaphylaxis 2 years ago.  He was working on a Par[employer] and it was a very hot environment and he had a fair amount of dust and mold exposure and then he passed out.  CPR was initiated and he was given epinephrine in the  field and transported to the hospital and admitted for several days with a negative workup for heart and neurological problems.  He has also had problems with nonsteroidal anti-inflammatory drug ingestion with the development of diffuse flushing and hives.  He has also had problems with the administration of both Trulicity and Ozempic with the development of explosive diarrhea and flushing.  He does have a history of an elevated tryptase level documented 7 years ago with presumed diagnosis of mast cell activating syndrome for which he uses an H1 and H2 receptor blocker two times per day  Past Medical History:  Diagnosis Date   Allergy    Anaphylaxis due to hymenoptera venom    Cataract 2016, 2017   bilateral extractions with lens implant bilaterally   Compression fracture of thoracic vertebra (HCC) T11    seen on CT   Degenerative joint disease (DJD) of lumbar spine    Diabetes mellitus without complication (HCC)    GERD (gastroesophageal reflux disease)    Hx of adenomatous colonic polyps 10/07/2018   Hypertension    Mast cell activation syndrome (HCC)    Melanoma (HCC)    Skin cancer    several places removed, and one Moths surgery on right temple   Vasovagal syncope 2018   associated with diarrhea, hyperglycemia    Past Surgical History:  Procedure Laterality Date   APPENDECTOMY     age 22   CATARACT EXTRACTION Bilateral    and lens implanted   KNEE SURGERY Left 1985   LASIK Bilateral  MOHS SURGERY Right    temple   RETINAL DETACHMENT SURGERY Right    SKIN BIOPSY     multiple skin cancers removed    Allergies as of 12/10/2022       Reactions   Bee Venom Anaphylaxis   Trulicity [dulaglutide] Other (See Comments)   Belching, stomach cramps, diarrhea   Victoza [liraglutide] Nausea Only   Nsaids Itching, Rash        Medication List    amLODipine 5 MG tablet Commonly known as: NORVASC Take 5 mg every morning by mouth.   aspirin 81 MG tablet Take 81 mg by  mouth daily.   Basaglar KwikPen 100 UNIT/ML Inject 18 Units into the skin daily.   Bayer Womens 81-777 MG Tabs Generic drug: Aspirin-Calcium Carbonate Take by mouth.   BD Pen Needle Nano U/F 32G X 4 MM Misc Generic drug: Insulin Pen Needle USE ONCE DAILY TO ADMINSTER INSULIN ONCE A DAY for 90 days   empagliflozin 25 MG Tabs tablet Commonly known as: JARDIANCE Take 25 mg by mouth daily.   EPINEPHrine 0.3 mg/0.3 mL Soaj injection Commonly known as: EPI-PEN Inject 0.3 mg into the muscle once.   famotidine 20 MG tablet Commonly known as: PEPCID Take 20 mg by mouth 2 (two) times daily.   loratadine 10 MG tablet Commonly known as: CLARITIN Take 10 mg by mouth 2 (two) times daily.   losartan 25 MG tablet Commonly known as: COZAAR Take 25 mg by mouth daily.   OneTouch Verio test strip Generic drug: glucose blood 3 (three) times daily.   pantoprazole 40 MG tablet Commonly known as: PROTONIX Take 40 mg by mouth 2 (two) times daily before a meal.   Repatha SureClick 140 MG/ML Soaj Generic drug: Evolocumab Inject 140 mg into the skin every 14 (fourteen) days.   VITAMIN B 12 PO   Vitamin D 50 MCG (2000 UT) tablet    Review of systems negative except as noted in HPI / PMHx or noted below:  Review of Systems  Constitutional: Negative.   HENT: Negative.    Eyes: Negative.   Respiratory: Negative.    Cardiovascular: Negative.   Gastrointestinal: Negative.   Genitourinary: Negative.   Musculoskeletal: Negative.   Skin: Negative.   Neurological: Negative.   Endo/Heme/Allergies: Negative.   Psychiatric/Behavioral: Negative.      Family History  Problem Relation Age of Onset   Allergic rhinitis Mother    Lung cancer Mother        light smoker   Diabetes Father    Heart attack Father        multiple    Cancer - Colon Neg Hx    Esophageal cancer Neg Hx    Rectal cancer Neg Hx     Social History   Socioeconomic History   Marital status: Married    Spouse  name: Not on file   Number of children: 2   Years of education: Not on file   Highest education level: Not on file  Occupational History   Occupation: Teaching laboratory technician: [employer]    Comment: Holiday representative  Tobacco Use   Smoking status: Never    Passive exposure: Never   Smokeless tobacco: Never  Vaping Use   Vaping status: Not on file  Substance and Sexual Activity   Alcohol use: Yes    Alcohol/week: 0.0 standard drinks of alcohol    Comment: occ   Drug use: No   Sexual activity: Not  on file  Other Topics Concern   Not on file  Social History Narrative   Married to [place]   Never smoker   Rare EtOH   No drugs   Social Determinants of Corporate investment banker Strain: Not on file  Food Insecurity: Not on file  Transportation Needs: Not on file  Physical Activity: Not on file  Stress: Not on file  Social Connections: Not on file  Intimate Partner Violence: Not on file    Environmental and Social history  Lives in a house with a dry environment, no animals located inside the household, no carpet in the bedroom, plastic on the bed, plastic on the pillow, no smoking ongoing inside the household.  Objective:   Vitals:   12/10/22 0918  BP: (!) 142/80  Pulse: 70  Temp: 98 F (36.7 C)  SpO2: 97%   Height: 5\' 8"  (172.7 cm) Weight: 190 lb 14.4 oz (86.6 kg)  Physical Exam Constitutional:      Appearance: He is not diaphoretic.  HENT:     Head: Normocephalic.     Right Ear: Tympanic membrane, ear canal and external ear normal.     Left Ear: Tympanic membrane, ear canal and external ear normal.     Nose: Nose normal. No mucosal edema or rhinorrhea.     Mouth/Throat:     Pharynx: Uvula midline. No oropharyngeal exudate.  Eyes:     Conjunctiva/sclera: Conjunctivae normal.  Neck:     Thyroid: No thyromegaly.     Trachea: Trachea normal. No tracheal tenderness or tracheal deviation.  Cardiovascular:     Rate and Rhythm: Normal rate  and regular rhythm.     Heart sounds: Normal heart sounds, S1 normal and S2 normal. No murmur heard. Pulmonary:     Effort: No respiratory distress.     Breath sounds: Normal breath sounds. No stridor. No wheezing or rales.  Lymphadenopathy:     Head:     Right side of head: No tonsillar adenopathy.     Left side of head: No tonsillar adenopathy.     Cervical: No cervical adenopathy.  Skin:    Findings: No erythema or rash.     Nails: There is no clubbing.  Neurological:     Mental Status: He is alert.     Diagnostics: Allergy skin tests were not performed.   Results of blood tests obtained 24 September 2016 identifies IgE antibodies directed against wasp 2.15 KU/L, white hornet 0.57 KU/L, yellowjacket 1.45 KU/L  Results of blood tests obtained 22 September 2018 identified tryptase 20.4 UG/L.   Assessment and Plan:    1. 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 Mast cell activation syndrome [facility])     Patient Instructions   1. Blood -hymenoptera IgE panel, fire ant IgE, tryptase, C-KIT mutation, Hereditary Alpha-Tryptasemia (H?T)  2. Epi-Pen (BRAND ONLY), benadryl, MD/ER evaluation for allergic reaction  3. Immunotherapy???  4.  Continue Claritin 10 mg + Famotidine 20 mg - 2 times per day  5. Obtain flu and Covid vaccine    Jessica Priest, MD Allergy / Immunology [facility]

## 2022-12-10 NOTE — Patient Instructions (Addendum)
  1. Blood -hymenoptera IgE panel, fire ant IgE, tryptase, C-KIT mutation, Hereditary Alpha-Tryptasemia (H?T)  2. Epi-Pen (BRAND ONLY), benadryl, MD/ER evaluation for allergic reaction  3. Immunotherapy???  4.  Continue Claritin 10 mg + Famotidine 20 mg - 2 times per day  5. Obtain flu and Covid vaccine

## 2022-12-14 LAB — HYMENOPTERA VENOM ALLERGY II
Bumblebee: <0.1 kU/L
Hornet, White Face, IgE: 0.92 kU/L — AB
Hornet, Yellow, IgE: 0.35 kU/L — AB
I001-IgE Honeybee: 0.38 kU/L — AB
I003-IgE Yellow Jacket: 1.55 kU/L — AB
I004-IgE Paper Wasp: 2.54 kU/L — AB
I208-IgE Api m 1: <0.1 kU/L
I209-IgE Ves v 5: 0.35 kU/L — AB
I210-IgE Pol d 5: 0.91 kU/L — AB
I211-IgE Ves v 1: 1.3 kU/L — AB
I214-IgE Api m 2: <0.1 kU/L
I215-IgE Api m 3: <0.1 kU/L
I216-IgE Api m 5: 7 kU/L — AB
I217-IgE Api m 10: <0.1 kU/L
Tryptase: 20.2 ug/L — ABNORMAL HIGH (ref 2.2–13.2)

## 2022-12-14 LAB — ALLERGEN COMPONENT COMMENTS

## 2022-12-14 LAB — ALLERGEN FIRE ANT: I070-IgE Fire Ant (Invicta): <0.1 kU/L

## 2022-12-14 LAB — TRYPTASE

## 2022-12-19 ENCOUNTER — Telehealth: Payer: Self-pay | Admitting: Allergy and Immunology

## 2022-12-19 LAB — C-KIT MUTATION, LIQUID TUMOR

## 2022-12-19 MED ORDER — EPINEPHRINE 0.3 MG/0.3ML IJ SOAJ: 0.3000 mg | Freq: Once | INTRAMUSCULAR | 1 refills | Status: AC

## 2022-12-19 NOTE — Telephone Encounter (Signed)
Called patient - DOB/Pharmacy verified - advised BRAND) Epi Pen Rx would be sent in to [facility].  Patient verbalized understanding, no further questions.

## 2022-12-19 NOTE — Telephone Encounter (Signed)
Patient called stating he had a visit a little over a week ago with Dr. Lucie Leather. Patient states he is suppose to have a name brand Epi Pen sent to [facility].

## 2022-12-20 LAB — OTHER LAB TEST: PDF: 0

## 2022-12-31 ENCOUNTER — Other Ambulatory Visit: Payer: Self-pay

## 2022-12-31 DIAGNOSIS — D8940 Mast cell activation, unspecified: Secondary | ICD-10-CM

## 2023-01-02 ENCOUNTER — Encounter: Payer: Self-pay | Admitting: *Deleted

## 2023-01-06 ENCOUNTER — Other Ambulatory Visit: Payer: Self-pay | Admitting: *Deleted

## 2023-01-06 DIAGNOSIS — D8940 Mast cell activation, unspecified: Secondary | ICD-10-CM

## 2023-01-18 LAB — PROSTAGLANDIN D2/CREATININE, U
Creatinine, Urine: 72 mg/dL
Prostaglandin D2, urine: 13 pg/mL
Prostaglandin D2/Cr Ratio: 18 ng/g

## 2023-01-20 LAB — HEMOGLOBIN A1C: Hemoglobin A1C: 7.7

## 2023-01-21 LAB — PROSTAGLANDIN D2/CREATININE, U
Creatinine, Urine: 82 mg/dL
Prostaglandin D2, urine: 11 pg/mL
Prostaglandin D2/Cr Ratio: 13 ng/g

## 2023-01-21 LAB — OPHTHALMOLOGY REPORT-SCANNED

## 2023-02-11 ENCOUNTER — Other Ambulatory Visit: Payer: Self-pay

## 2023-02-11 DIAGNOSIS — D8940 Mast cell activation, unspecified: Secondary | ICD-10-CM

## 2023-03-11 ENCOUNTER — Other Ambulatory Visit: Payer: Self-pay | Admitting: Cardiovascular Disease

## 2023-03-11 DIAGNOSIS — E7849 Other hyperlipidemia: Secondary | ICD-10-CM

## 2023-05-27 ENCOUNTER — Ambulatory Visit: Payer: Medicare Other | Admitting: Physician Assistant

## 2023-05-30 ENCOUNTER — Ambulatory Visit (INDEPENDENT_AMBULATORY_CARE_PROVIDER_SITE_OTHER): Admitting: Physician Assistant

## 2023-05-30 VITALS — BP 118/62 | HR 77 | Ht 68.0 in | Wt 195.0 lb

## 2023-05-30 DIAGNOSIS — E1165 Type 2 diabetes mellitus with hyperglycemia: Secondary | ICD-10-CM

## 2023-05-30 DIAGNOSIS — Z794 Long term (current) use of insulin: Secondary | ICD-10-CM | POA: Diagnosis not present

## 2023-05-30 DIAGNOSIS — E1159 Type 2 diabetes mellitus with other circulatory complications: Secondary | ICD-10-CM

## 2023-05-30 DIAGNOSIS — E785 Hyperlipidemia, unspecified: Secondary | ICD-10-CM

## 2023-05-30 DIAGNOSIS — R3912 Poor urinary stream: Secondary | ICD-10-CM | POA: Insufficient documentation

## 2023-05-30 DIAGNOSIS — B351 Tinea unguium: Secondary | ICD-10-CM | POA: Diagnosis not present

## 2023-05-30 DIAGNOSIS — E1169 Type 2 diabetes mellitus with other specified complication: Secondary | ICD-10-CM

## 2023-05-30 DIAGNOSIS — I152 Hypertension secondary to endocrine disorders: Secondary | ICD-10-CM

## 2023-05-30 NOTE — Assessment & Plan Note (Signed)
 Mild, discussed tx of terbinafine x 12 weeks, will check cmp first for liver enzymes, if normal, will rx.

## 2023-05-30 NOTE — Assessment & Plan Note (Signed)
 PSA normal 7/24, but will repeat

## 2023-05-30 NOTE — Progress Notes (Signed)
 Established patient visit   Patient: Douglas Nunez   DOB: 07/12/1955   68 y.o. Male  MRN: [id number] Visit Date: 05/30/2023  Today's healthcare provider: Alfredia Ferguson, PA-C   Cc. Chronic f/u  Subjective    Hypertension, follow-up  BP Readings from Last 3 Encounters:  05/30/23 118/62  12/10/22 (!) 142/80  11/28/22 134/72   Wt Readings from Last 3 Encounters:  05/30/23 195 lb (88.5 kg)  12/10/22 190 lb 14.4 oz (86.6 kg)  11/28/22 193 lb (87.5 kg)       Outside blood pressures are well controlled, 120s/60s.   Pertinent labs Lab Results  Component Value Date   CHOL 86 10/08/2022   HDL 38 10/08/2022   LDLCALC 33 10/08/2022   TRIG 74 10/08/2022   CHOLHDL 2.8 09/24/2021   Lab Results  Component Value Date   NA 140 11/02/2022   K 3.4 (L) 11/02/2022   CREATININE 1.21 11/02/2022   GFRNONAA >60 11/02/2022   GLUCOSE 211 (H) 11/02/2022   TSH 3.715 08/08/2020     The ASCVD Risk score (Arnett DK, et al., 2019) failed to calculate for the following reasons:   The valid total cholesterol range is 130 to 320 mg/dL  --------------------------------------------------------------------------------------------------- Diabetes Mellitus Type II, Follow-up  Lab Results  Component Value Date   HGBA1C 7.7 01/20/2023   HGBA1C 7.7 10/08/2022   HGBA1C 8.1 (H) 09/24/2021   Wt Readings from Last 3 Encounters:  05/30/23 195 lb (88.5 kg)  12/10/22 190 lb 14.4 oz (86.6 kg)  11/28/22 193 lb (87.5 kg)   Pt is seen by endocrinology for his diabetes.  Pertinent Labs: Lab Results  Component Value Date   CHOL 86 10/08/2022   HDL 38 10/08/2022   LDLCALC 33 10/08/2022   TRIG 74 10/08/2022   CHOLHDL 2.8 09/24/2021   Lab Results  Component Value Date   NA 140 11/02/2022   K 3.4 (L) 11/02/2022   CREATININE 1.21 11/02/2022   GFRNONAA >60 11/02/2022   MICRALBCREAT 1.1 11/28/2022      ---------------------------------------------------------------------------------------------------  Pt reports possibility of a toenail fungus, particularly on his right foot. Denies pain.  He also reports appreciating a slight change in his urine stream, especially at the end.   Medications: Outpatient Medications Prior to Visit  Medication Sig   amLODipine (NORVASC) 5 MG tablet Take 5 mg every morning by mouth.    aspirin 81 MG tablet Take 81 mg by mouth daily.   BD PEN NEEDLE NANO U/F 32G X 4 MM MISC USE ONCE DAILY TO ADMINSTER INSULIN ONCE A DAY for 90 days   Cholecalciferol (VITAMIN D) 50 MCG (2000 UT) tablet    Cyanocobalamin (VITAMIN B 12 PO)    empagliflozin (JARDIANCE) 25 MG TABS tablet Take 25 mg by mouth daily.    Evolocumab (REPATHA SURECLICK) 140 MG/ML SOAJ Inject 140 mg into the skin every 14 (fourteen) days.   famotidine (PEPCID) 20 MG tablet Take 20 mg by mouth 2 (two) times daily.   Insulin Glargine (BASAGLAR KWIKPEN) 100 UNIT/ML Inject 40 Units into the skin daily.   loratadine (CLARITIN) 10 MG tablet Take 10 mg by mouth 2 (two) times daily.   losartan (COZAAR) 25 MG tablet Take 25 mg by mouth daily.   ONETOUCH VERIO test strip 3 (three) times daily.   pantoprazole (PROTONIX) 40 MG tablet Take 40 mg by mouth 2 (two) times daily before a meal.    [DISCONTINUED] Aspirin-Calcium Carbonate (BAYER WOMENS) 81-777 MG TABS Take by  mouth.   No facility-administered medications prior to visit.    Review of Systems  Constitutional:  Negative for fatigue and fever.  Respiratory:  Negative for cough and shortness of breath.   Cardiovascular:  Negative for chest pain, palpitations and leg swelling.  Neurological:  Negative for dizziness and headaches.       Objective    BP 118/62 Comment: home value  Pulse 77   Ht 5\' 8"  (1.727 m)   Wt 195 lb (88.5 kg)   BMI 29.65 kg/m    Physical Exam Constitutional:      General: He is awake.     Appearance: He is  well-developed.  HENT:     Head: Normocephalic.  Eyes:     Conjunctiva/sclera: Conjunctivae normal.  Cardiovascular:     Rate and Rhythm: Normal rate and regular rhythm.     Pulses:          Dorsalis pedis pulses are 2+ on the right side and 2+ on the left side.       Posterior tibial pulses are 2+ on the right side and 2+ on the left side.     Heart sounds: Normal heart sounds.  Pulmonary:     Effort: Pulmonary effort is normal.     Breath sounds: Normal breath sounds.  Feet:     Right foot:     Protective Sensation: 4 sites tested.  4 sites sensed.     Skin integrity: Skin integrity normal.     Toenail Condition: Right toenails are abnormally thick. Fungal disease present.    Left foot:     Protective Sensation: 4 sites tested.  4 sites sensed.     Skin integrity: Skin integrity normal.     Toenail Condition: Left toenails are abnormally thick.  Skin:    General: Skin is warm.  Neurological:     Mental Status: He is alert and oriented to person, place, and time.  Psychiatric:        Attention and Perception: Attention normal.        Mood and Affect: Mood normal.        Speech: Speech normal.        Behavior: Behavior is cooperative.     Results for orders placed or performed in visit on 05/30/23  Hemoglobin A1c  Result Value Ref Range   Hemoglobin A1C 7.7     Assessment & Plan    Type 2 diabetes mellitus with hyperglycemia, with long-term current use of insulin (HCC) Assessment & Plan: Pt follows with endo, A1c 11/24 7.7% Endo considering stable, no changes, basalgar 40 U, Jardiance 25 mg  No statin but on repatha, asa 81 mg  Uacr utd.  Foot exam completed today  Orders: -     Comprehensive metabolic panel  Hyperlipidemia associated with type 2 diabetes mellitus (HCC) Assessment & Plan: Managed on repatha, ldl at goal   Hypertension associated with diabetes [facility]) Assessment & Plan: Chronic, white coat HTN -- well documented and controlled at home Manages  with amlodipine 5 mg losartan 25 mg  F/u 6 mo   Onychomycosis Assessment & Plan: Mild, discussed tx of terbinafine x 12 weeks, will check cmp first for liver enzymes, if normal, will rx.   Weak urine stream Assessment & Plan: PSA normal 7/24, but will repeat  Orders: -     PSA    Return in about 6 months (around 11/30/2023) for chronic conditions.       Alfredia Ferguson, PA-C  [facility] at [facility] [phone] (phone) [phone] (fax)  [facility]

## 2023-05-30 NOTE — Assessment & Plan Note (Signed)
 Chronic, white coat HTN -- well documented and controlled at home Manages with amlodipine 5 mg losartan 25 mg  F/u 6 mo

## 2023-05-30 NOTE — Assessment & Plan Note (Addendum)
 Pt follows with endo, A1c 11/24 7.7% Endo considering stable, no changes, basalgar 40 U, Jardiance 25 mg  No statin but on repatha, asa 81 mg  Uacr utd.  Foot exam completed today

## 2023-05-30 NOTE — Assessment & Plan Note (Signed)
 Managed on repatha, ldl at goal

## 2023-05-31 LAB — COMPREHENSIVE METABOLIC PANEL
ALT: 21 IU/L (ref 0–44)
AST: 23 IU/L (ref 0–40)
Albumin: 4.3 g/dL (ref 3.9–4.9)
Alkaline Phosphatase: 83 IU/L (ref 44–121)
BUN/Creatinine Ratio: 17 (ref 10–24)
BUN: 19 mg/dL (ref 8–27)
Bilirubin Total: 0.3 mg/dL (ref 0.0–1.2)
CO2: 21 mmol/L (ref 20–29)
Calcium: 9.4 mg/dL (ref 8.6–10.2)
Chloride: 105 mmol/L (ref 96–106)
Creatinine, Ser: 1.09 mg/dL (ref 0.76–1.27)
Globulin, Total: 2.8 g/dL (ref 1.5–4.5)
Glucose: 205 mg/dL — ABNORMAL HIGH (ref 70–99)
Potassium: 4.5 mmol/L (ref 3.5–5.2)
Sodium: 142 mmol/L (ref 134–144)
Total Protein: 7.1 g/dL (ref 6.0–8.5)
eGFR: 74 mL/min/{1.73_m2} (ref >59)

## 2023-05-31 LAB — PSA: Prostate Specific Ag, Serum: 2.2 ng/mL (ref 0.0–4.0)

## 2023-06-02 ENCOUNTER — Encounter: Payer: Self-pay | Admitting: Physician Assistant

## 2023-06-11 ENCOUNTER — Other Ambulatory Visit: Payer: Self-pay | Admitting: Physician Assistant

## 2023-06-11 DIAGNOSIS — B351 Tinea unguium: Secondary | ICD-10-CM

## 2023-06-11 MED ORDER — TERBINAFINE HCL 250 MG PO TABS: 250.0000 mg | ORAL_TABLET | Freq: Every day | ORAL | 0 refills | Status: DC

## 2023-06-17 ENCOUNTER — Ambulatory Visit (INDEPENDENT_AMBULATORY_CARE_PROVIDER_SITE_OTHER): Admitting: Allergy and Immunology

## 2023-06-17 ENCOUNTER — Telehealth: Payer: Self-pay

## 2023-06-17 VITALS — BP 138/88 | HR 80 | Temp 98.2°F | Resp 16 | Ht 69.0 in | Wt 194.5 lb

## 2023-06-17 DIAGNOSIS — T782XXD Anaphylactic shock, unspecified, subsequent encounter: Secondary | ICD-10-CM | POA: Diagnosis not present

## 2023-06-17 DIAGNOSIS — T63481D Toxic effect of venom of other arthropod, accidental (unintentional), subsequent encounter: Secondary | ICD-10-CM

## 2023-06-17 DIAGNOSIS — T63481S Toxic effect of venom of other arthropod, accidental (unintentional), sequela: Secondary | ICD-10-CM

## 2023-06-17 DIAGNOSIS — D8940 Mast cell activation, unspecified: Secondary | ICD-10-CM

## 2023-06-17 MED ORDER — FAMOTIDINE 20 MG PO TABS: 20.0000 mg | ORAL_TABLET | Freq: Two times a day (BID) | ORAL | 1 refills | Status: DC

## 2023-06-17 MED ORDER — EPINEPHRINE 0.3 MG/0.3ML IJ SOAJ: 0.3000 mg | INTRAMUSCULAR | 1 refills | Status: DC | PRN

## 2023-06-17 MED ORDER — LORATADINE 10 MG PO TABS: 10.0000 mg | ORAL_TABLET | Freq: Two times a day (BID) | ORAL | 1 refills | Status: DC

## 2023-06-17 NOTE — Telephone Encounter (Signed)
 Please refer to hematology and further evaluation of possible mastocytosis, per Dr. Lucie Leather.

## 2023-06-17 NOTE — Progress Notes (Unsigned)
 [place] - [place] - [place] - [place] - [place]   Follow-up Note  Referring Provider: Alfredia Ferguson, PA-C Primary Provider: Alfredia Ferguson, PA-C Date of Office Visit: 06/17/2023  Subjective:   Douglas Nunez (DOB: Jan 12, 1956) is a 68 y.o. 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  I last saw him in this clinic during his initial evaluation of 10 December 2022.  He has really done well since his last visit without any recurrent episodes of anaphylaxis.  He was stung by a wasp on his thumb and he immediately took an additional 2 Pepcid's and 2 Claritin's and self-administered epinephrine within 5 minutes of sting and did well without reaction without any anaphylaxis.  He remains on H1 and H2 receptor blocker on a consistent basis.  He does have an injectable epinephrine device.  Allergies as of 06/17/2023       Reactions   Bee Venom Anaphylaxis   Trulicity [dulaglutide] Diarrhea, Nausea Only   Belching, stomach cramps, diarrhea   Metformin Hcl Other (See Comments)   Flushed   Nsaids Itching, Rash   Victoza [liraglutide] Nausea Only        Medication List    amLODipine 5 MG tablet Commonly known as: NORVASC Take 5 mg every morning by mouth.   aspirin 81 MG tablet Take 81 mg by mouth daily.   Basaglar KwikPen 100 UNIT/ML Inject 40 Units into the skin daily.   BD Pen Needle Nano U/F 32G X 4 MM Misc Generic drug: Insulin Pen Needle USE ONCE DAILY TO ADMINSTER INSULIN ONCE A DAY for 90 days   empagliflozin 25 MG Tabs tablet Commonly known as: JARDIANCE Take 25 mg by mouth daily.   EPINEPHrine 0.3 mg/0.3 mL Soaj injection Commonly known as: EPI-PEN Inject 0.3 mg into the muscle as needed.   famotidine 20 MG tablet Commonly known as: PEPCID Take 20 mg by mouth 2 (two) times  daily.   loratadine 10 MG tablet Commonly known as: CLARITIN Take 10 mg by mouth 2 (two) times daily.   losartan 25 MG tablet Commonly known as: COZAAR Take 25 mg by mouth daily.   OneTouch Verio test strip Generic drug: glucose blood 3 (three) times daily.   Repatha SureClick 140 MG/ML Soaj Generic drug: Evolocumab Inject 140 mg into the skin every 14 (fourteen) days.   terbinafine 250 MG tablet Commonly known as: LAMISIL Take 1 tablet (250 mg total) by mouth daily.   VITAMIN B 12 PO   Vitamin D 50 MCG (2000 UT) tablet    Past Medical History:  Diagnosis Date   Allergy    Anaphylaxis due to hymenoptera venom    Cataract 2016, 2017   bilateral extractions with lens implant bilaterally   Compression fracture of thoracic vertebra (HCC) T11    seen on CT   Degenerative joint disease (DJD) of lumbar spine    Diabetes mellitus without complication (HCC)    GERD (gastroesophageal reflux disease)    Hx of adenomatous colonic polyps 10/07/2018   Hypertension    Mast cell activation syndrome (HCC)    Melanoma (HCC)    Skin cancer    several places removed, and one Moths surgery on right temple   Vasovagal syncope 2018   associated with diarrhea, hyperglycemia    Past Surgical History:  Procedure Laterality Date   APPENDECTOMY  age 38   CATARACT EXTRACTION Bilateral    and lens implanted   KNEE SURGERY Left 1985   LASIK Bilateral    MOHS SURGERY Right    temple   RETINAL DETACHMENT SURGERY Right    SKIN BIOPSY     multiple skin cancers removed    Review of systems negative except as noted in HPI / PMHx or noted below:  Review of Systems  Constitutional: Negative.   HENT: Negative.    Eyes: Negative.   Respiratory: Negative.    Cardiovascular: Negative.   Gastrointestinal: Negative.   Genitourinary: Negative.   Musculoskeletal: Negative.   Skin: Negative.   Neurological: Negative.   Endo/Heme/Allergies: Negative.   Psychiatric/Behavioral: Negative.        Objective:   Vitals:   06/17/23 1118  BP: (!) 170/84  Pulse: 80  Resp: 16  Temp: 98.2 F (36.8 C)  SpO2: 97%   Height: 5\' 9"  (175.3 cm)  Weight: 194 lb 8 oz (88.2 kg)   Physical Exam Constitutional:      Appearance: He is not diaphoretic.  HENT:     Head: Normocephalic.     Right Ear: Tympanic membrane, ear canal and external ear normal.     Left Ear: Tympanic membrane, ear canal and external ear normal.     Nose: Nose normal. No mucosal edema or rhinorrhea.     Mouth/Throat:     Pharynx: Uvula midline. No oropharyngeal exudate.  Eyes:     Conjunctiva/sclera: Conjunctivae normal.  Neck:     Thyroid: No thyromegaly.     Trachea: Trachea normal. No tracheal tenderness or tracheal deviation.  Cardiovascular:     Rate and Rhythm: Normal rate and regular rhythm.     Heart sounds: Normal heart sounds, S1 normal and S2 normal. No murmur heard. Pulmonary:     Effort: No respiratory distress.     Breath sounds: Normal breath sounds. No stridor. No wheezing or rales.  Lymphadenopathy:     Head:     Right side of head: No tonsillar adenopathy.     Left side of head: No tonsillar adenopathy.     Cervical: No cervical adenopathy.  Skin:    Findings: No erythema or rash.     Nails: There is no clubbing.  Neurological:     Mental Status: He is alert.     Diagnostics:    Results of blood tests obtained 10 December 2022 identifies tryptase 20.2 UG/L, negative c-kit mutation, tryptase gene copy normal, IgE antibodies directed against vespids, wasp, and low titers directed against honeybee at 0.38 KU/L with Api M5 7.0 KU/L, negative fire ant..  Results of urine test obtained 08 January 2023 identified prostaglandin D2/creatinine ratio 13  Assessment and Plan:   1. Mast cell activation syndrome (HCC)   2. Anaphylaxis due to hymenoptera venom, accidental or unintentional, sequela    1. Continue Claritin 10 mg + Famotidine 20 mg - 2 times per day  2. Epi-Pen, benadryl,  MD/ER evaluation for allergic reaction  3. Can start immunotherapy in [place]  4. Influenza = Tamiflu. Covid = Paxlovid.  5. Return to clinic in 6 months or earlier if problem  We will be starting wean on immunotherapy in our [facility] office given the fact that he lives within 10 minutes of that office.  He will continue on an H1 and H2 receptor blocker and have epinephrine handy in case he ever develops an allergic or anaphylactic reaction.  I will see him back  in this clinic in 6 months.  Laurette Schimke, MD Allergy / Immunology [facility]

## 2023-06-17 NOTE — Patient Instructions (Addendum)
  1. Continue Claritin 10 mg + Famotidine 20 mg - 2 times per day  2. Epi-Pen, benadryl, MD/ER evaluation for allergic reaction  3. Can start immunotherapy in [place]  4. Influenza = Tamiflu. Covid = Paxlovid.  5. Return to clinic in 6 months or earlier if problem

## 2023-06-18 ENCOUNTER — Encounter: Payer: Self-pay | Admitting: Allergy and Immunology

## 2023-06-30 ENCOUNTER — Ambulatory Visit

## 2023-07-04 ENCOUNTER — Ambulatory Visit

## 2023-07-04 DIAGNOSIS — Z91038 Other insect allergy status: Secondary | ICD-10-CM | POA: Diagnosis not present

## 2023-07-04 NOTE — Progress Notes (Signed)
 Immunotherapy   Patient Details  Name: Douglas Nunez MRN: [id number] Date of Birth: 12/30/1955  07/04/2023  Douglas Nunez here to start venom injections for MV and Wasp. Patient waited 30 minutes with no problems.   Frequency:1 time per week Epi-Pen:Epi-Pen Available  Consent signed and patient instructions given.   Denton Flakes 07/04/2023, 8:33 AM

## 2023-07-11 ENCOUNTER — Other Ambulatory Visit: Payer: Self-pay | Admitting: Cardiovascular Disease

## 2023-07-11 ENCOUNTER — Encounter: Payer: Self-pay | Admitting: Pharmacist

## 2023-07-11 ENCOUNTER — Ambulatory Visit (INDEPENDENT_AMBULATORY_CARE_PROVIDER_SITE_OTHER)

## 2023-07-11 DIAGNOSIS — I2510 Atherosclerotic heart disease of native coronary artery without angina pectoris: Secondary | ICD-10-CM

## 2023-07-11 DIAGNOSIS — E782 Mixed hyperlipidemia: Secondary | ICD-10-CM

## 2023-07-11 DIAGNOSIS — Z91038 Other insect allergy status: Secondary | ICD-10-CM | POA: Diagnosis not present

## 2023-07-15 ENCOUNTER — Ambulatory Visit (INDEPENDENT_AMBULATORY_CARE_PROVIDER_SITE_OTHER)

## 2023-07-15 VITALS — Ht 69.0 in | Wt 194.0 lb

## 2023-07-15 DIAGNOSIS — Z0000 Encounter for general adult medical examination without abnormal findings: Secondary | ICD-10-CM

## 2023-07-15 DIAGNOSIS — Z1211 Encounter for screening for malignant neoplasm of colon: Secondary | ICD-10-CM

## 2023-07-15 NOTE — Addendum Note (Signed)
 Addended by: Seabron Cypress B on: 07/15/2023 12:42 PM   Modules accepted: Orders

## 2023-07-15 NOTE — Progress Notes (Signed)
 Subjective:   Douglas Nunez is a 68 y.o. who presents for a Medicare Wellness preventive visit.  Visit Complete: Virtual I connected with  Douglas Nunez on 07/15/23 by a audio enabled telemedicine application and verified that I am speaking with the correct person using two identifiers.  Patient Location: Home  Provider Location: Home Office  I discussed the limitations of evaluation and management by telemedicine. The patient expressed understanding and agreed to proceed.  Vital Signs: Because this visit was a virtual/telehealth visit, some criteria may be missing or patient reported. Any vitals not documented were not able to be obtained and vitals that have been documented are patient reported.  VideoDeclined- This patient declined Librarian, academic. Therefore the visit was completed with audio only.  Persons Participating in Visit: Patient.  AWV Questionnaire: No: Patient Medicare AWV questionnaire was not completed prior to this visit.  Cardiac Risk Factors include: advanced age (>38men, >49 women);male gender;diabetes mellitus;hypertension;dyslipidemia     Objective:    Today's Vitals   07/15/23 0825  Weight: 194 lb (88 kg)  Height: 5\' 9"  (1.753 m)   Body mass index is 28.65 kg/m.     07/15/2023   12:24 PM 11/02/2022    2:37 PM 08/08/2020    9:16 PM 08/08/2020    1:12 PM 03/18/2017   11:09 AM 01/24/2017    7:12 PM 05/13/2016    1:03 AM  Advanced Directives  Does Patient Have a Medical Advance Directive? No No No No No No No  Would patient like information on creating a medical advance directive? Yes (MAU/Ambulatory/Procedural Areas - Information given) No - Patient declined No - Patient declined No - Patient declined No - Patient declined No - Patient declined     Current Medications (verified) Outpatient Encounter Medications as of 07/15/2023  Medication Sig   amLODipine  (NORVASC ) 5 MG tablet Take 5 mg every morning by mouth.    aspirin  81  MG tablet Take 81 mg by mouth daily.   BD PEN NEEDLE NANO U/F 32G X 4 MM MISC USE ONCE DAILY TO ADMINSTER INSULIN  ONCE A DAY for 90 days   Cholecalciferol (VITAMIN D ) 50 MCG (2000 UT) tablet    Cyanocobalamin (VITAMIN B 12 PO)    empagliflozin  (JARDIANCE ) 25 MG TABS tablet Take 25 mg by mouth daily.    EPINEPHrine  0.3 mg/0.3 mL IJ SOAJ injection Inject 0.3 mg into the muscle as needed.   Evolocumab  (REPATHA  SURECLICK) 140 MG/ML SOAJ Inject 140 mg into the skin every 14 (fourteen) days.   famotidine  (PEPCID ) 20 MG tablet Take 1 tablet (20 mg total) by mouth 2 (two) times daily.   Insulin  Glargine (BASAGLAR  KWIKPEN) 100 UNIT/ML Inject 40 Units into the skin daily.   loratadine  (CLARITIN ) 10 MG tablet Take 1 tablet (10 mg total) by mouth 2 (two) times daily.   losartan  (COZAAR ) 25 MG tablet Take 25 mg by mouth daily.   ONETOUCH VERIO test strip 3 (three) times daily.   terbinafine  (LAMISIL ) 250 MG tablet Take 1 tablet (250 mg total) by mouth daily.   No facility-administered encounter medications on file as of 07/15/2023.    Allergies (verified) Bee venom, Trulicity [dulaglutide], Metformin hcl, Nsaids, and Victoza [liraglutide]   History: Past Medical History:  Diagnosis Date   Allergy    Anaphylaxis due to hymenoptera venom    Cataract 2016, 2017   bilateral extractions with lens implant bilaterally   Compression fracture of thoracic vertebra (HCC) T11    seen  on CT   Degenerative joint disease (DJD) of lumbar spine    Diabetes mellitus without complication (HCC)    GERD (gastroesophageal reflux disease)    Hx of adenomatous colonic polyps 10/07/2018   Hypertension    Mast cell activation syndrome (HCC)    Melanoma (HCC)    Skin cancer    several places removed, and one Moths surgery on right temple   Vasovagal syncope 2018   associated with diarrhea, hyperglycemia   Past Surgical History:  Procedure Laterality Date   APPENDECTOMY     age 25   CATARACT EXTRACTION Bilateral     and lens implanted   KNEE SURGERY Left 1985   LASIK Bilateral    MOHS SURGERY Right    temple   RETINAL DETACHMENT SURGERY Right    SKIN BIOPSY     multiple skin cancers removed   Family History  Problem Relation Age of Onset   Allergic rhinitis Mother    Lung cancer Mother        light smoker   Diabetes Father    Heart attack Father        multiple    Cancer - Colon Neg Hx    Esophageal cancer Neg Hx    Rectal cancer Neg Hx    Social History   Socioeconomic History   Marital status: Married    Spouse name: Not on file   Number of children: 2   Years of education: Not on file   Highest education level: Associate degree: academic program  Occupational History   Occupation: Teaching laboratory technician: [employer]    Comment: Holiday representative  Tobacco Use   Smoking status: Never    Passive exposure: Never   Smokeless tobacco: Never  Vaping Use   Vaping status: Not on file  Substance and Sexual Activity   Alcohol use: Yes    Alcohol/week: 0.0 standard drinks of alcohol    Comment: occ   Drug use: No   Sexual activity: Not on file  Other Topics Concern   Not on file  Social History Narrative   Married to Douglas Nunez   Never smoker   Rare EtOH   No drugs   Social Drivers of Corporate investment banker Strain: Low Risk  (07/15/2023)   Overall Financial Resource Strain (CARDIA)    Difficulty of Paying Living Expenses: Not hard at all  Food Insecurity: No Food Insecurity (07/15/2023)   Hunger Vital Sign    Worried About Running Out of Food in the Last Year: Never true    Ran Out of Food in the Last Year: Never true  Transportation Needs: No Transportation Needs (07/15/2023)   PRAPARE - Administrator, Civil Service (Medical): No    Lack of Transportation (Non-Medical): No  Physical Activity: Insufficiently Active (07/15/2023)   Exercise Vital Sign    Days of Exercise per Week: 2 days    Minutes of Exercise per Session: 60 min  Stress: No  Stress Concern Present (07/15/2023)   [employer] of Occupational Health - Occupational Stress Questionnaire    Feeling of Stress : Not at all  Social Connections: Unknown (07/15/2023)   Social Connection and Isolation Panel [NHANES]    Frequency of Communication with Friends and Family: More than three times a week    Frequency of Social Gatherings with Friends and Family: More than three times a week    Attends Religious Services: Patient declined  Active Member of Clubs or Organizations: Patient declined    Attends Banker Meetings: More than 4 times per year    Marital Status: Married    Tobacco Counseling Counseling given: Not Answered    Clinical Intake:  Pre-visit preparation completed: Yes  Pain : No/denies pain     Diabetes: No  Lab Results  Component Value Date   HGBA1C 7.7 01/20/2023   HGBA1C 7.7 10/08/2022   HGBA1C 8.1 (H) 09/24/2021     How often do you need to have someone help you when you read instructions, pamphlets, or other written materials from your doctor or pharmacy?: 1 - Never  Interpreter Needed?: No  Information entered by :: Seabron Cypress LPN   Activities of Daily Living     07/15/2023   12:20 PM  In your present state of health, do you have any difficulty performing the following activities:  Hearing? 0  Vision? 0  Difficulty concentrating or making decisions? 0  Walking or climbing stairs? 0  Dressing or bathing? 0  Doing errands, shopping? 0  Preparing Food and eating ? N  Using the Toilet? N  In the past six months, have you accidently leaked urine? N  Do you have problems with loss of bowel control? N  Managing your Medications? N  Managing your Finances? N  Housekeeping or managing your Housekeeping? N    Patient Care Team: Ginger Lai as PCP - General (Physician Assistant) Gordy Lauber, MD as Consulting Physician (Endocrinology)  Indicate any recent Medical Services you may have received  from other than [facility] providers in the past year (date may be approximate).     Assessment:   This is a routine wellness examination for [place].  Hearing/Vision screen Hearing Screening - Comments:: Denies hearing difficulties   Vision Screening - Comments:: Wears rx glasses - up to date with routine eye exams with [facility]    Goals Addressed             This Visit's Progress    Remain active and independent         Depression Screen     07/15/2023   12:23 PM 11/28/2022    9:17 AM 03/18/2017   11:09 AM 10/17/2014    3:34 PM 09/08/2014    2:40 PM  PHQ 2/9 Scores  PHQ - 2 Score 0 0 0 0 0    Fall Risk     07/15/2023   12:24 PM 11/28/2022    9:16 AM 03/18/2017   11:09 AM 10/17/2014    3:34 PM 09/08/2014    2:40 PM  Fall Risk   Falls in the past year? 0 0 No No No  Number falls in past yr: 0 0     Injury with Fall? 0 0     Risk for fall due to : No Fall Risks No Fall Risks     Follow up Falls prevention discussed;Education provided;Falls evaluation completed Falls evaluation completed       MEDICARE RISK AT HOME:  Medicare Risk at Home Any stairs in or around the home?: No If so, are there any without handrails?: No Home free of loose throw rugs in walkways, pet beds, electrical cords, etc?: Yes Adequate lighting in your home to reduce risk of falls?: Yes Life alert?: No Use of a cane, walker or w/c?: No Grab bars in the bathroom?: Yes Shower chair or bench in shower?: No Elevated toilet seat or a handicapped toilet?: Yes  TIMED  UP AND GO:  Was the test performed?  No  Cognitive Function: 6CIT completed        07/15/2023   12:25 PM  6CIT Screen  What Year? 0 points  What month? 0 points  What time? 0 points  Count back from 20 0 points  Months in reverse 0 points  Repeat phrase 0 points  Total Score 0 points    Immunizations  There is no immunization history on file for this patient.  Screening Tests Health Maintenance  Topic Date Due   FOOT EXAM   Never done   OPHTHALMOLOGY EXAM  Never done   Hepatitis C Screening  Never done   DTaP/Tdap/Td (1 - Tdap) Never done   Pneumonia Vaccine 78+ Years old (1 of 2 - PCV) Never done   Zoster Vaccines- Shingrix (1 of 2) Never done   COVID-19 Vaccine (4 - 2024-25 season) 11/10/2022   Colonoscopy  10/02/2023   HEMOGLOBIN A1C  07/20/2023   INFLUENZA VACCINE  10/10/2023   Diabetic kidney evaluation - Urine ACR  11/28/2023   Diabetic kidney evaluation - eGFR measurement  05/29/2024   Medicare Annual Wellness (AWV)  07/14/2024   HPV VACCINES  Aged Out   Meningococcal B Vaccine  Aged Out    Health Maintenance  Health Maintenance Due  Topic Date Due   FOOT EXAM  Never done   OPHTHALMOLOGY EXAM  Never done   Hepatitis C Screening  Never done   DTaP/Tdap/Td (1 - Tdap) Never done   Pneumonia Vaccine 62+ Years old (1 of 2 - PCV) Never done   Zoster Vaccines- Shingrix (1 of 2) Never done   COVID-19 Vaccine (4 - 2024-25 season) 11/10/2022   Colonoscopy  10/02/2023   Health Maintenance Items Addressed: Patient declines vaccines; referral placed for colonoscopy    Additional Screening:  Vision Screening: Recommended annual ophthalmology exams for early detection of glaucoma and other disorders of the eye.  Dental Screening: Recommended annual dental exams for proper oral hygiene  Community Resource Referral / Chronic Care Management: CRR required this visit?  No   CCM required this visit?  No     Plan:     I have personally reviewed and noted the following in the patient's chart:   Medical and social history Use of alcohol, tobacco or illicit drugs  Current medications and supplements including opioid prescriptions. Patient is not currently taking opioid prescriptions. Functional ability and status Nutritional status Physical activity Advanced directives List of other physicians Hospitalizations, surgeries, and ER visits in previous 12 months Vitals Screenings to include  cognitive, depression, and falls Referrals and appointments  In addition, I have reviewed and discussed with patient certain preventive protocols, quality metrics, and best practice recommendations. A written personalized care plan for preventive services as well as general preventive health recommendations were provided to patient.     Seabron Cypress [place], [place]   03/16/1094   After Visit Summary: (MyChart) Due to this being a telephonic visit, the after visit summary with patients personalized plan was offered to patient via MyChart   Notes: Nothing significant to report at this time.

## 2023-07-15 NOTE — Patient Instructions (Signed)
 Mr. Regner , Thank you for taking time to come for your Medicare Wellness Visit. I appreciate your ongoing commitment to your health goals. Please review the following plan we discussed and let me know if I can assist you in the future.   Referrals/Orders/Follow-Ups/Clinician Recommendations: Aim for 30 minutes of exercise or brisk walking, 6-8 glasses of water, and 5 servings of fruits and vegetables each day.  This is a list of the screening recommended for you and due dates:  Health Maintenance  Topic Date Due   Complete foot exam   Never done   Eye exam for diabetics  Never done   Hepatitis C Screening  Never done   DTaP/Tdap/Td vaccine (1 - Tdap) Never done   Pneumonia Vaccine (1 of 2 - PCV) Never done   Zoster (Shingles) Vaccine (1 of 2) Never done   COVID-19 Vaccine (4 - 2024-25 season) 11/10/2022   Colon Cancer Screening  10/02/2023   Hemoglobin A1C  07/20/2023   Flu Shot  10/10/2023   Yearly kidney health urinalysis for diabetes  11/28/2023   Yearly kidney function blood test for diabetes  05/29/2024   Medicare Annual Wellness Visit  07/14/2024   HPV Vaccine  Aged Out   Meningitis B Vaccine  Aged Out    Advanced directives: (ACP Link)Information on Advanced Care Planning can be found at [place]  Secretary of [facility] Advance Health Care Directives Advance Health Care Directives. [email or web address]   Next Medicare Annual Wellness Visit scheduled for next year: Yes  Have you seen your provider in the last 6 months (3 months if uncontrolled diabetes)? Yes

## 2023-07-17 NOTE — Progress Notes (Signed)
 HEMATOLOGY/ONCOLOGY CONSULTATION NOTE  Date of Service: 07/18/2023  Patient Care Team: Douglas Nunez, Douglas Nunez as PCP - General (Physician Assistant) Douglas Lauber, MD as Consulting Physician (Endocrinology)  CHIEF COMPLAINTS/PURPOSE OF CONSULTATION:  further evaluation of possible mastocytosis   HISTORY OF PRESENTING ILLNESS:  Douglas Nunez is a wonderful 68 y.o. male who has been referred to us  Douglas Nunez for further evaluation of possible mastocytosis.   He was last seen by Douglas Nunez on 06/17/2023. C-KIT mutation testing on 12/10/2022 noted to be negative.   Patient presents with his wife during today's visit.   Patient noted to have anaphylaxis reaction to bee venom. Patient reports that he has been stung by Yellow jackets/wasps/honeybees about 300 times. Patient reports that he is typically mostly stung by wasps and yellow jackets. He reports that his first reaction occurred 10-12 years ago due to wasp sting. Patient did not have any reaction issues prior to that time.   He reports that around 10-12 years ago, patient also began having itchiness in his arms and scalp as well as redness after taking two tablets of Aleve. He notes that he took Aleve due to back pain after cutting wood and showering.   Patient reports that he cut wood again 1 week later, and took 2 tablets of Aleve at that time to manage back pain. He endorsed the same symptoms of itchy arms and scalp with redness. Patient was then recommended by his general practitioner to stop taking anti-inflammatory medication. Patient does not take anti-inflamatory medication at this time.   He reports having another episode which he attributed to cooking with certain spices.   Patient reports an incident of being stung by a wasp in the temple, causing itchiness in his arms/scalp with redness. He denies any swelling with his symptoms.  His wife reports that she has needed to perform CPR on patient in the past and he has needed to  present to the ED several times.   Patient denies working in a profession with frequent bee contact, though he does note living in an area close to nature.   Patient reports that he was previously on Trulicity for DM, which managed his blood sugars well. He reports that after about 3 months, at which time he would receive an injection once a week, he began endorsing explosive diarrhea two nights later and began going unconscious. Patient notes that he had the same symptoms the next week a couple days after taking Trulicity. Patient reports that he was thought to have a vasovagal reaction and received workup evaluating mast cells.   Patient is unsure if he has any baseline tryptase testing at a time without having a reaction.   His wife reports that patient did have an episode with flushing previously which was thought to be triggered by Metformin, which was then stopped.   Patient reports that he was last stung by a bee 4 weeks ago and took liquid benadryl .   At baseline, he is taking Claritin  10 MG twice daily, Famotidine  20 mg twice daily, and liquid benadryl  as needed. Patient denies taking Singulair or any Mast cell stabilizers.   Patient has been on antihistamine and Pepsid combination with Douglas Nunez since about 2018.   Patient denies having any spontaneous large, full-body reactions outside of bee/wasp stings and anti-inflammatory medication, and Metformin.   Patient reports an incident in which he was working in a closet near a drain pipe scraping black mold in 95 degree heat. He reports feeling  an insect bite, possibly from mosquito or spider, though he did not see a welt on his arm. Patient reports feeling poorly once he finished his work, and eventually became unconscious before being able to inject himself with Epipen . He received CPR and was eventually injected with Epipen . Patient believes his syncope event was triggered either by insect bite or black mold.   Patient reports that he  did previously receive steroids at the hospital after having cardiac workup. He was not found to have any cardiac issues. His calcium  level were slightly elevated at that time.   He denies previously having any whole-body CT scan or bone marrow biopsy in the past.   Patient reports mild work-related arthritis. He denies any arthritis related to rheumatoid.   Patient reports having findings of increased reactivity overall to wasps, yellow jackets, and honey bees. I discussed that this could be a function of sensitization.   Patient denies any abdominal pain. Patient denies any hx of liver issues.   He reports lower back pain attributed to work.   Patient denies any unexplained fever, chills, night sweats, or sudden weight loss. Patient denies any fixed skin rashes.   Patient reports that he began desensitization injection therapy with Douglas Nunez 3 weeks ago.   MEDICAL HISTORY:  Past Medical History:  Diagnosis Date   Allergy    Anaphylaxis due to hymenoptera venom    Cataract 2016, 2017   bilateral extractions with lens implant bilaterally   Compression fracture of thoracic vertebra (HCC) T11    seen on CT   Degenerative joint disease (DJD) of lumbar spine    Diabetes mellitus without complication (HCC)    GERD (gastroesophageal reflux disease)    Hx of adenomatous colonic polyps 10/07/2018   Hypertension    Mast cell activation syndrome (HCC)    Melanoma (HCC)    Skin cancer    several places removed, and one Moths surgery on right temple   Vasovagal syncope 2018   associated with diarrhea, hyperglycemia    SURGICAL HISTORY: Past Surgical History:  Procedure Laterality Date   APPENDECTOMY     age 57   CATARACT EXTRACTION Bilateral    and lens implanted   KNEE SURGERY Left 1985   LASIK Bilateral    MOHS SURGERY Right    temple   RETINAL DETACHMENT SURGERY Right    SKIN BIOPSY     multiple skin cancers removed    SOCIAL HISTORY: Social History   Socioeconomic  History   Marital status: Married    Spouse name: Not on file   Number of children: 2   Years of education: Not on file   Highest education level: Associate degree: academic program  Occupational History   Occupation: Teaching laboratory technician: [employer]    Comment: Holiday representative  Tobacco Use   Smoking status: Never    Passive exposure: Never   Smokeless tobacco: Never  Vaping Use   Vaping status: Not on file  Substance and Sexual Activity   Alcohol use: Yes    Alcohol/week: 0.0 standard drinks of alcohol    Comment: occ   Drug use: No   Sexual activity: Not on file  Other Topics Concern   Not on file  Social History Narrative   Married to [name]   Never smoker   Rare EtOH   No drugs   Social Drivers of Corporate investment banker Strain: Low Risk  (07/15/2023)   Overall Financial  Resource Strain (CARDIA)    Difficulty of Paying Living Expenses: Not hard at all  Food Insecurity: No Food Insecurity (07/15/2023)   Hunger Vital Sign    Worried About Running Out of Food in the Last Year: Never true    Ran Out of Food in the Last Year: Never true  Transportation Needs: No Transportation Needs (07/15/2023)   PRAPARE - Administrator, Civil Service (Medical): No    Lack of Transportation (Non-Medical): No  Physical Activity: Insufficiently Active (07/15/2023)   Exercise Vital Sign    Days of Exercise per Week: 2 days    Minutes of Exercise per Session: 60 min  Stress: No Stress Concern Present (07/15/2023)   [employer] of Occupational Health - Occupational Stress Questionnaire    Feeling of Stress : Not at all  Social Connections: Unknown (07/15/2023)   Social Connection and Isolation Panel [NHANES]    Frequency of Communication with Friends and Family: More than three times a week    Frequency of Social Gatherings with Friends and Family: More than three times a week    Attends Religious Services: Patient declined    Database administrator or  Organizations: Patient declined    Attends Engineer, structural: More than 4 times per year    Marital Status: Married  Catering manager Violence: Not At Risk (07/15/2023)   Humiliation, Afraid, Rape, and Kick questionnaire    Fear of Current or Ex-Partner: No    Emotionally Abused: No    Physically Abused: No    Sexually Abused: No    FAMILY HISTORY: Family History  Problem Relation Age of Onset   Allergic rhinitis Mother    Lung cancer Mother        light smoker   Diabetes Father    Heart attack Father        multiple    Cancer - Colon Neg Hx    Esophageal cancer Neg Hx    Rectal cancer Neg Hx     ALLERGIES:  is allergic to bee venom, trulicity [dulaglutide], metformin hcl, nsaids, and victoza [liraglutide].  MEDICATIONS:  Current Outpatient Medications  Medication Sig Dispense Refill   amLODipine  (NORVASC ) 5 MG tablet Take 5 mg every morning by mouth.      aspirin  81 MG tablet Take 81 mg by mouth daily.     BD PEN NEEDLE NANO U/F 32G X 4 MM MISC USE ONCE DAILY TO ADMINSTER INSULIN  ONCE A DAY for 90 days     Cholecalciferol (VITAMIN D ) 50 MCG (2000 UT) tablet      Cyanocobalamin (VITAMIN B 12 PO)      empagliflozin  (JARDIANCE ) 25 MG TABS tablet Take 25 mg by mouth daily.      EPINEPHrine  0.3 mg/0.3 mL IJ SOAJ injection Inject 0.3 mg into the muscle as needed. 2 each 1   Evolocumab  (REPATHA  SURECLICK) 140 MG/ML SOAJ Inject 140 mg into the skin every 14 (fourteen) days. 6 mL 3   famotidine  (PEPCID ) 20 MG tablet Take 1 tablet (20 mg total) by mouth 2 (two) times daily. 180 tablet 1   Insulin  Glargine (BASAGLAR  KWIKPEN) 100 UNIT/ML Inject 40 Units into the skin daily.     loratadine  (CLARITIN ) 10 MG tablet Take 1 tablet (10 mg total) by mouth 2 (two) times daily. 180 tablet 1   losartan  (COZAAR ) 25 MG tablet Take 25 mg by mouth daily.     ONETOUCH VERIO test strip 3 (three) times daily.  terbinafine  (LAMISIL ) 250 MG tablet Take 1 tablet (250 mg total) by mouth  daily. 84 tablet 0   No current facility-administered medications for this visit.    REVIEW OF SYSTEMS:    10 Point review of Systems was done is negative except as noted above.  PHYSICAL EXAMINATION: ECOG PERFORMANCE STATUS: {CHL ONC ECOG ZO:[id number]}  .There were no vitals filed for this visit. There were no vitals filed for this visit. .There is no height or weight on file to calculate BMI.  GENERAL:alert, in no acute distress and comfortable SKIN: no acute rashes, no significant lesions EYES: conjunctiva are pink and non-injected, sclera anicteric OROPHARYNX: MMM, no exudates, no oropharyngeal erythema or ulceration NECK: supple, no JVD LYMPH:  no palpable lymphadenopathy in the cervical, axillary or inguinal regions LUNGS: clear to auscultation b/l with normal respiratory effort HEART: regular rate & rhythm ABDOMEN:  normoactive bowel sounds , non tender, not distended. Extremity: no pedal edema PSYCH: alert & oriented x 3 with fluent speech NEURO: no focal motor/sensory deficits  LABORATORY DATA:  I have reviewed the data as listed  .    Latest Ref Rng & Units 11/02/2022    2:15 PM 10/08/2022   12:00 AM 09/24/2021    8:38 AM  CBC  WBC 4.0 - 10.5 K/uL 8.1  5.1     4.5   Hemoglobin 13.0 - 17.0 g/dL 81.1  91.4     78.2   Hematocrit 39.0 - 52.0 % 46.1  47     49.5   Platelets 150 - 400 K/uL 251  221     192      This result is from an external source.    .    Latest Ref Rng & Units 05/30/2023    3:04 PM 11/02/2022    2:15 PM 10/08/2022   12:00 AM  CMP  Glucose 70 - 99 mg/dL 956  213    BUN 8 - 27 mg/dL 19  19    Creatinine 0.86 - 1.27 mg/dL 5.78  4.69  0.9      Sodium 134 - 144 mmol/L 142  140  141      Potassium 3.5 - 5.2 mmol/L 4.5  3.4  4.1      Chloride 96 - 106 mmol/L 105  109  107      CO2 20 - 29 mmol/L 21  19    Calcium  8.6 - 10.2 mg/dL 9.4  8.9  9.6      Total Protein 6.0 - 8.5 g/dL 7.1     Total Bilirubin 0.0 - 1.2 mg/dL 0.3     Alkaline Phos 44  - 121 IU/L 83   64      AST 0 - 40 IU/L 23   20      ALT 0 - 44 IU/L 21   23         This result is from an external source.     RADIOGRAPHIC STUDIES: I have personally reviewed the radiological images as listed and agreed with the findings in the report. No results found.  ASSESSMENT & PLAN:  68 y.o. male with:  Mast cell activation syndrome -Possible mastocytosis  Anaphylaxis reaction to bee venom  PLAN:  -C-KIT mutation testing on 12/10/2022 noted to be negative -mastocytosis is much less likely -patient's tryptase levels noted to be persistently elevated -discussed that elevated tryptase could be an acute reaction without obvious mast cell disorder  -discussed goal to determine if the underlying disorder  of the mast cell is functional or neoplasmic -discussed that if his reactions are triggered by bees/wasps/yellow jackets, he could just have a specific antibody and triggering chemical causing a reaction to those specific things -Patient has no typical fixed hives typically seen with systemic mastocytosis -discussed that there is a spectrum of mast cell disorders. Discussed that there can be a situation where the mast cells can react unusually and release inflammatory chemicals on one end of the spectrum. On the other end, there can be very aggressive mast cell disorders with net effect of a lot of chemical release in different possible areas including the bone marrow or organs.  -educated patient that treatment for mast cell activation syndrome would be treated differently from mastocytosis.  -educated patient that there is a large range of mast cell disorders.  -discussed that mast cells can become too sensitive and respond to large variety of possible triggers such as external allergens, certain foods, etc., causing them to release inflammatory products such as histamines, which can produce different symptoms -discussed that systemic mastocytosis can also present in triggered  fashion  -educated patient that there can be sensitization with multiple insect stings, causing him to react  -did not feel any enlarged lymph nodes or enlarged spleen during physical examination -discussed details of desensitation therapy involving blocking igG antibodies that block the protein before it is exposed to igE antibody that trigger reaction. -discussed that there are sometimes systemic mastocytosis associated with other blood disorders as well, and certain blood tests can give indirect clues though findings would not be definitive -discussed that bone marrow biopsy would be most definitive test  -discussed details of bone marrow biopsy procedure -there is no strong concern for mast cell cancer, though we will evaluate further especially given his severe symptoms  -will order blood tests  -will order whole-body scan and bone marrow biopsy to rule out Neoplastic mast cell disorder -discussed that there is mutation testing on bone marrow as well -continue antihistamines  -discussed that there are options of targeted therpaies for reducing mast cells and his reactivity -discussed option of taking singulair at night time to block some of the other inflammatory pathways if needed -discussed option of mast cell stabilizer, Cromolyn sodium, if there is concern for any reactions without a triggering event -discussed option of complex evaluation at [facility], which would require patient to be off of his current antihistamine medications for 3-6 months and to avoid any bee/wasp stings. Discussed that this may not be very practical.  -recommend avoiding stings from yellow jackets/bees/wasps as best as he can -answered all of patient's and his wife's questions in detail  FOLLOW-UP: Labs today CT chest/abd/pelvis in 1 week CT bone marrow biopsy in 1-2 weeks RTC with Dr Salomon Cree in 4 weeks  The total time spent in the appointment was *** minutes* .  All of the patient's questions were answered with  apparent satisfaction. The patient knows to call the clinic with any problems, questions or concerns.   Jacquelyn Matt MD MS AAHIVMS [facility] Hematology/Oncology Physician [facility]  .*Total Encounter Time as defined by the Centers for Medicare and Medicaid Services includes, in addition to the face-to-face time of a patient visit (documented in the note above) non-face-to-face time: obtaining and reviewing outside history, ordering and reviewing medications, tests or procedures, care coordination (communications with other health care professionals or caregivers) and documentation in the medical record.    I,Mitra Faeizi,acting as a Neurosurgeon for Jacquelyn Matt, MD.,have documented  all relevant documentation on the behalf of Jacquelyn Matt, MD,as directed by  Jacquelyn Matt, MD while in the presence of Jacquelyn Matt, MD.  ***

## 2023-07-18 ENCOUNTER — Ambulatory Visit (INDEPENDENT_AMBULATORY_CARE_PROVIDER_SITE_OTHER)

## 2023-07-18 ENCOUNTER — Inpatient Hospital Stay

## 2023-07-18 ENCOUNTER — Inpatient Hospital Stay: Attending: Hematology | Admitting: Hematology

## 2023-07-18 VITALS — BP 142/76 | HR 60 | Temp 97.5°F | Resp 13 | Wt 193.0 lb

## 2023-07-18 DIAGNOSIS — D4709 Other mast cell neoplasms of uncertain behavior: Secondary | ICD-10-CM

## 2023-07-18 DIAGNOSIS — Z91038 Other insect allergy status: Secondary | ICD-10-CM

## 2023-07-18 DIAGNOSIS — D4702 Systemic mastocytosis: Secondary | ICD-10-CM | POA: Diagnosis not present

## 2023-07-18 LAB — CBC WITH DIFFERENTIAL (CANCER CENTER ONLY)
Abs Immature Granulocytes: 0.02 10*3/uL (ref 0.00–0.07)
Basophils Absolute: 0.1 10*3/uL (ref 0.0–0.1)
Basophils Relative: 1 %
Eosinophils Absolute: 0.1 10*3/uL (ref 0.0–0.5)
Eosinophils Relative: 3 %
HCT: 46 % (ref 39.0–52.0)
Hemoglobin: 15.5 g/dL (ref 13.0–17.0)
Immature Granulocytes: 0 %
Lymphocytes Relative: 31 %
Lymphs Abs: 1.5 10*3/uL (ref 0.7–4.0)
MCH: 30.4 pg (ref 26.0–34.0)
MCHC: 33.7 g/dL (ref 30.0–36.0)
MCV: 90.2 fL (ref 80.0–100.0)
Monocytes Absolute: 0.3 10*3/uL (ref 0.1–1.0)
Monocytes Relative: 7 %
Neutro Abs: 2.9 10*3/uL (ref 1.7–7.7)
Neutrophils Relative %: 58 %
Platelet Count: 215 10*3/uL (ref 150–400)
RBC: 5.1 MIL/uL (ref 4.22–5.81)
RDW: 12.8 % (ref 11.5–15.5)
WBC Count: 4.9 10*3/uL (ref 4.0–10.5)
nRBC: 0 % (ref 0.0–0.2)

## 2023-07-18 LAB — CMP (CANCER CENTER ONLY)
ALT: 22 U/L (ref 0–44)
AST: 27 U/L (ref 15–41)
Albumin: 4.5 g/dL (ref 3.5–5.0)
Alkaline Phosphatase: 68 U/L (ref 38–126)
Anion gap: 6 (ref 5–15)
BUN: 18 mg/dL (ref 8–23)
CO2: 29 mmol/L (ref 22–32)
Calcium: 9.6 mg/dL (ref 8.9–10.3)
Chloride: 108 mmol/L (ref 98–111)
Creatinine: 0.91 mg/dL (ref 0.61–1.24)
GFR, Estimated: >60 mL/min (ref >60)
Glucose, Bld: 85 mg/dL (ref 70–99)
Potassium: 4.4 mmol/L (ref 3.5–5.1)
Sodium: 143 mmol/L (ref 135–145)
Total Bilirubin: 0.7 mg/dL (ref 0.0–1.2)
Total Protein: 7.7 g/dL (ref 6.5–8.1)

## 2023-07-18 LAB — LACTATE DEHYDROGENASE: LDH: 153 U/L (ref 98–192)

## 2023-07-20 ENCOUNTER — Other Ambulatory Visit: Payer: Self-pay | Admitting: Physician Assistant

## 2023-07-20 DIAGNOSIS — B351 Tinea unguium: Secondary | ICD-10-CM

## 2023-07-20 LAB — TRYPTASE: Tryptase: 18.4 ug/L — ABNORMAL HIGH (ref 2.2–13.2)

## 2023-07-21 ENCOUNTER — Ambulatory Visit (INDEPENDENT_AMBULATORY_CARE_PROVIDER_SITE_OTHER)

## 2023-07-21 DIAGNOSIS — Z91038 Other insect allergy status: Secondary | ICD-10-CM

## 2023-07-21 LAB — KAPPA/LAMBDA LIGHT CHAINS
Kappa free light chain: 23.8 mg/L — ABNORMAL HIGH (ref 3.3–19.4)
Kappa, lambda light chain ratio: 1.43 (ref 0.26–1.65)
Lambda free light chains: 16.6 mg/L (ref 5.7–26.3)

## 2023-07-25 LAB — MULTIPLE MYELOMA PANEL, SERUM
Albumin SerPl Elph-Mcnc: 3.8 g/dL (ref 2.9–4.4)
Albumin/Glob SerPl: 1.2 (ref 0.7–1.7)
Alpha 1: 0.2 g/dL (ref 0.0–0.4)
Alpha2 Glob SerPl Elph-Mcnc: 0.8 g/dL (ref 0.4–1.0)
B-Globulin SerPl Elph-Mcnc: 1 g/dL (ref 0.7–1.3)
Gamma Glob SerPl Elph-Mcnc: 1.3 g/dL (ref 0.4–1.8)
Globulin, Total: 3.3 g/dL (ref 2.2–3.9)
IgA: 329 mg/dL (ref 61–437)
IgG (Immunoglobin G), Serum: 1267 mg/dL (ref 603–1613)
IgM (Immunoglobulin M), Srm: 120 mg/dL (ref 20–172)
Total Protein ELP: 7.1 g/dL (ref 6.0–8.5)

## 2023-07-28 ENCOUNTER — Ambulatory Visit

## 2023-07-28 DIAGNOSIS — Z91038 Other insect allergy status: Secondary | ICD-10-CM | POA: Diagnosis not present

## 2023-07-31 ENCOUNTER — Encounter: Payer: Self-pay | Admitting: Internal Medicine

## 2023-08-01 ENCOUNTER — Ambulatory Visit (INDEPENDENT_AMBULATORY_CARE_PROVIDER_SITE_OTHER)

## 2023-08-01 ENCOUNTER — Ambulatory Visit (HOSPITAL_COMMUNITY)
Admission: RE | Admit: 2023-08-01 | Discharge: 2023-08-01 | Disposition: A | Discharge status: Home / Self Care | Source: Ambulatory Visit | Attending: Hematology | Admitting: Hematology

## 2023-08-01 DIAGNOSIS — D4709 Other mast cell neoplasms of uncertain behavior: Secondary | ICD-10-CM | POA: Insufficient documentation

## 2023-08-01 DIAGNOSIS — Z91038 Other insect allergy status: Secondary | ICD-10-CM

## 2023-08-05 ENCOUNTER — Ambulatory Visit (INDEPENDENT_AMBULATORY_CARE_PROVIDER_SITE_OTHER)

## 2023-08-05 DIAGNOSIS — Z91038 Other insect allergy status: Secondary | ICD-10-CM | POA: Diagnosis not present

## 2023-08-11 ENCOUNTER — Ambulatory Visit

## 2023-08-11 ENCOUNTER — Ambulatory Visit (INDEPENDENT_AMBULATORY_CARE_PROVIDER_SITE_OTHER)

## 2023-08-11 DIAGNOSIS — Z91038 Other insect allergy status: Secondary | ICD-10-CM

## 2023-08-18 ENCOUNTER — Ambulatory Visit: Admitting: Hematology

## 2023-08-18 ENCOUNTER — Ambulatory Visit (INDEPENDENT_AMBULATORY_CARE_PROVIDER_SITE_OTHER)

## 2023-08-18 DIAGNOSIS — Z91038 Other insect allergy status: Secondary | ICD-10-CM | POA: Diagnosis not present

## 2023-08-19 ENCOUNTER — Ambulatory Visit: Admitting: Hematology

## 2023-08-20 ENCOUNTER — Encounter: Payer: Self-pay | Admitting: Physician Assistant

## 2023-08-21 ENCOUNTER — Telehealth: Payer: Self-pay

## 2023-08-21 ENCOUNTER — Other Ambulatory Visit (HOSPITAL_COMMUNITY): Payer: Self-pay

## 2023-08-21 ENCOUNTER — Other Ambulatory Visit: Payer: Self-pay | Admitting: Physician Assistant

## 2023-08-21 DIAGNOSIS — E782 Mixed hyperlipidemia: Secondary | ICD-10-CM

## 2023-08-21 DIAGNOSIS — E1165 Type 2 diabetes mellitus with hyperglycemia: Secondary | ICD-10-CM

## 2023-08-21 DIAGNOSIS — I152 Hypertension secondary to endocrine disorders: Secondary | ICD-10-CM

## 2023-08-21 DIAGNOSIS — Z8249 Family history of ischemic heart disease and other diseases of the circulatory system: Secondary | ICD-10-CM

## 2023-08-21 MED ORDER — REPATHA SURECLICK 140 MG/ML ~~LOC~~ SOAJ: 1.0000 | SUBCUTANEOUS | 3 refills | Status: AC

## 2023-08-21 NOTE — Telephone Encounter (Signed)
 Pharmacy Patient Advocate Encounter   Received notification from Patient Pharmacy that prior authorization for Repatha  SureClick is required/requested.   Insurance verification completed.   The patient is insured through [facility] .   Per test claim: PA required and submitted KEY/EOC/Request #: [id number] APPROVED from 03/12/23 to 08/20/24. Unable to obtain price due to refill too soon rejection, last fill date 08/20/24 next available fill date 10/28/23

## 2023-08-22 ENCOUNTER — Other Ambulatory Visit (HOSPITAL_COMMUNITY): Payer: Self-pay

## 2023-08-22 ENCOUNTER — Other Ambulatory Visit: Payer: Self-pay | Admitting: Radiology

## 2023-08-22 DIAGNOSIS — D4709 Other mast cell neoplasms of uncertain behavior: Secondary | ICD-10-CM

## 2023-08-22 NOTE — H&P (Signed)
 Chief Complaint: Concern for possible systemic mastocytosis with elevated tryptase levels; referred for image guided bone marrow biopsy for further evaluation  Referring Provider(s): Kale,G  Supervising Physician: Art Largo  Patient Status: [facility] - Out-pt  History of Present Illness: Douglas Nunez. is a 68 y.o. male with past medical history of T11 compression fracture, degenerative joint disease, diabetes, GERD, colon polyps, hypertension, melanoma/ skin cancer, as well as anaphylactic reactions to bee/wasp/yellowjackets and elevated tryptase levels.  Due to concern for possible systemic mastocytosis he presents today for bone marrow biopsy for further evaluation/rule out neoplastic mast cell disorder.  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 venom    Cataract 2016, 2017   bilateral extractions with lens implant bilaterally   Compression fracture of thoracic vertebra (HCC) T11    seen on CT   Degenerative joint disease (DJD) of lumbar spine    Diabetes mellitus without complication (HCC)    GERD (gastroesophageal reflux disease)    Hx of adenomatous colonic polyps 10/07/2018   Hypertension    Mast cell activation syndrome (HCC)    Melanoma (HCC)    Skin cancer    several places removed, and one Moths surgery on right temple   Vasovagal syncope 2018   associated with diarrhea, hyperglycemia    Past Surgical History:  Procedure Laterality Date   APPENDECTOMY     age 67   CATARACT EXTRACTION Bilateral    and lens implanted   KNEE SURGERY Left 1985   LASIK Bilateral    MOHS SURGERY Right    temple   RETINAL DETACHMENT SURGERY Right    SKIN BIOPSY     multiple skin cancers removed    Allergies: Bee venom, Trulicity [dulaglutide], Metformin hcl, Nsaids, and Victoza [liraglutide]  Medications: Prior to Admission medications   Medication Sig Start Date End Date Taking? Authorizing Provider   amLODipine  (NORVASC ) 5 MG tablet Take 5 mg every morning by mouth.     [provider]  aspirin  81 MG tablet Take 81 mg by mouth daily.    [provider]  BD PEN NEEDLE NANO U/F 32G X 4 MM MISC USE ONCE DAILY TO ADMINSTER INSULIN  ONCE A DAY for 90 days    [provider]  Cholecalciferol (VITAMIN D ) 50 MCG (2000 UT) tablet     [provider]  Cyanocobalamin (VITAMIN B 12 PO)     [provider]  empagliflozin  (JARDIANCE ) 25 MG TABS tablet Take 25 mg by mouth daily.     [provider]  EPINEPHrine  0.3 mg/0.3 mL IJ SOAJ injection Inject 0.3 mg into the muscle as needed. 06/17/23   Kozlow, Rema Care, MD  Evolocumab  (REPATHA  SURECLICK) 140 MG/ML SOAJ Inject 140 mg into the skin every 14 (fourteen) days. 08/21/23   Trenton Frock, PA-C  famotidine  (PEPCID ) 20 MG tablet Take 1 tablet (20 mg total) by mouth 2 (two) times daily. 06/17/23   Kozlow, Rema Care, MD  Insulin  Glargine (BASAGLAR  KWIKPEN) 100 UNIT/ML Inject 40 Units into the skin daily. 07/31/20   [provider]  loratadine  (CLARITIN ) 10 MG tablet Take 1 tablet (10 mg total) by mouth 2 (two) times daily. 06/17/23   Kozlow, Rema Care, MD  losartan  (COZAAR ) 25 MG tablet Take 25 mg by mouth daily. 06/11/20   [provider]  [facility] VERIO test strip 3 (three) times daily. 11/09/19   [provider]  terbinafine  (LAMISIL ) 250 MG tablet Take 1 tablet (250 mg total) by mouth daily. 06/11/23   Trenton Frock, PA-C     Family History  Problem Relation Age of Onset   Allergic rhinitis Mother    Lung cancer Mother        light smoker   Diabetes Father    Heart attack Father        multiple    Cancer - Colon Neg Hx    Esophageal cancer Neg Hx    Rectal cancer Neg Hx     Social History   Socioeconomic History   Marital status: Married    Spouse name: Not on file   Number of children: 2   Years of education: Not on file   Highest education level: Associate degree: academic  program  Occupational History   Occupation: Teaching laboratory technician: [employer]    Comment: Holiday representative  Tobacco Use   Smoking status: Never    Passive exposure: Never   Smokeless tobacco: Never  Vaping Use   Vaping status: Not on file  Substance and Sexual Activity   Alcohol use: Yes    Alcohol/week: 0.0 standard drinks of alcohol    Comment: occ   Drug use: No   Sexual activity: Not on file  Other Topics Concern   Not on file  Social History Narrative   Married to [name]   Never smoker   Rare EtOH   No drugs   Social Drivers of Corporate investment banker Strain: Low Risk  (07/15/2023)   Overall Financial Resource Strain (CARDIA)    Difficulty of Paying Living Expenses: Not hard at all  Food Insecurity: No Food Insecurity (07/15/2023)   Hunger Vital Sign    Worried About Running Out of Food in the Last Year: Never true    Ran Out of Food in the Last Year: Never true  Transportation Needs: No Transportation Needs (07/15/2023)   PRAPARE - Administrator, Civil Service (Medical): No    Lack of Transportation (Non-Medical): No  Physical Activity: Insufficiently Active (07/15/2023)   Exercise Vital Sign    Days of Exercise per Week: 2 days    Minutes of Exercise per Session: 60 min  Stress: No Stress Concern Present (07/15/2023)   [employer] of Occupational Health - Occupational Stress Questionnaire    Feeling of Stress : Not at all  Social Connections: Unknown (07/15/2023)   Social Connection and Isolation Panel    Frequency of Communication with Friends and Family: More than three times a week    Frequency of Social Gatherings with Friends and Family: More than three times a week    Attends Religious Services: Patient declined    Database administrator or Organizations: Patient declined    Attends Engineer, structural: More than 4 times per year    Marital Status: Married       Review of Systems  Vital  Signs:   Advance Care Plan: No documents on file  Physical Exam  Imaging: CT CHEST ABDOMEN PELVIS WO CONTRAST Result Date: 08/04/2023 CLINICAL DATA:  Mastocytosis, evaluate for lesion. * Tracking Code: BO * EXAM: CT CHEST, ABDOMEN AND PELVIS WITHOUT CONTRAST TECHNIQUE: Multidetector CT imaging of the chest, abdomen and pelvis was performed following the standard protocol without IV contrast. RADIATION DOSE REDUCTION: This exam was performed according to the departmental dose-optimization program which includes automated exposure control, adjustment of the mA and/or kV  according to patient size and/or use of iterative reconstruction technique. COMPARISON:  Multiple priors including CT September 06, 2016. FINDINGS: CT CHEST FINDINGS Cardiovascular: Aortic atherosclerosis. Coronary artery calcifications. Normal size heart. No significant pericardial effusion/thickening. Mediastinum/Nodes: No suspicious thyroid  nodule. No pathologically enlarged mediastinal, hilar or axillary lymph nodes. Small hiatal hernia. Lungs/Pleura: Bilateral scattered pulmonary nodules measure up to 9 mm in the right middle lobe on image 75/4 are stable from CT October 25, 2018 compatible with a benign finding. Solid 5 mm pulmonary nodule in the right lung apex on image 31/4 was outside of the field of view on examination from 2020. Musculoskeletal: No aggressive lytic or blastic lesion of bone. Diffuse demineralization of bone. CT ABDOMEN PELVIS FINDINGS Hepatobiliary: Unremarkable noncontrast enhanced appearance of the hepatic parenchyma. Gallbladder is unremarkable. No biliary ductal dilation. Pancreas: No pancreatic ductal dilation or evidence of acute inflammation. Spleen: No splenomegaly. Adrenals/Urinary Tract: 9 mm right adrenal nodule on image 47/2 is stable dating back to September 06, 2016 compatible with a benign adenoma in requiring no independent imaging follow-up. Nodularity of the genu of the left adrenal gland measuring 9 mm on  image 51/2 is also stable dating back to 2018 compatible with a benign adenoma and requiring no independent imaging follow-up. Masslike asymmetry in the left kidney which distorts the renal contour measures 2.8 cm on image 62/2 it Hounsfield units of 29, this reflects a cortical outpouching on contrast enhanced CT September 06, 2016 compatible with a benign finding. Bilateral renal lesions which measure fluid density are compatible with cysts and considered benign requiring no independent imaging follow-up. Additional tiny bilateral hypodensities are technically too small to accurately characterize but statistically likely reflect cysts and considered benign requiring no independent imaging follow-up. Nonobstructive 1 mm right interpolar renal stone. Urinary bladder is unremarkable for degree of distension. Stomach/Bowel: Tiny hiatal hernia. Colonic diverticulosis. No evidence of acute bowel inflammation or obstruction. Vascular/Lymphatic: Aortic atherosclerosis. Normal caliber abdominal aorta. Retroaortic left renal vein. No pathologically enlarged abdominal or pelvic lymph nodes. Reproductive: Prostate is unremarkable. Other: No significant abdominopelvic free fluid. Musculoskeletal: Sclerotic lesion in the anterior L4 vertebral body is favored a benign bone island. Diffuse demineralization of bone. Multilevel degenerative changes spine. IMPRESSION: 1. Diffuse demineralization of the lumbar spine may reflect osteopenia or a marrow replacement process. 2. Sclerotic foci in the L4 vertebral body are favored benign bone islands. 3. No discrete suspicious mass or lymphadenopathy in the chest, abdomen or pelvis on this noncontrast enhanced examination. 4. Solid 5 mm pulmonary nodule in the right lung apex was outside of the field of view on examination from 2020. Consider follow-up chest CT in 6-12 months. 5. Nonobstructive 1 mm right interpolar renal stone. 6. Colonic diverticulosis without evidence of acute bowel  inflammation. 7.  Aortic Atherosclerosis (ICD10-I70.0). Electronically Signed   By: Tama Fails M.D.   On: 08/04/2023 08:40    Labs:  CBC: Recent Labs    10/08/22 0000 11/02/22 1415 07/18/23 1317  WBC 5.1 8.1 4.9  HGB 16.0 15.5 15.5  HCT 47 46.1 46.0  PLT 221 251 215    COAGS: No results for input(s): INR, APTT in the last 8760 hours.  BMP: Recent Labs    10/08/22 0000 11/02/22 1415 05/30/23 1504 07/18/23 1317  NA 141 140 142 143  K 4.1 3.4* 4.5 4.4  CL 107 109 105 108  CO2  --  19* 21 29  GLUCOSE  --  211* 205* 85  BUN  --  19 19  18  CALCIUM  9.6 8.9 9.4 9.6  CREATININE 0.9 1.21 1.09 0.91  GFRNONAA  --  >60  --  >60    LIVER FUNCTION TESTS: Recent Labs    10/08/22 0000 05/30/23 1504 07/18/23 1317  BILITOT  --  0.3 0.7  AST 20 23 27   ALT 23 21 22   ALKPHOS 64 83 68  PROT  --  7.1 7.7  ALBUMIN  --  4.3 4.5    TUMOR MARKERS: No results for input(s): AFPTM, CEA, CA199, CHROMGRNA in the last 8760 hours.  Assessment and Plan: 68 y.o. male with past medical history of T11 compression fracture, degenerative joint disease, diabetes, GERD, colon polyps, hypertension, melanoma/ skin cancer, as well as anaphylactic reactions to bee/wasp/yellowjackets and elevated tryptase levels.  Due to concern for possible systemic mastocytosis he presents today for bone marrow biopsy for further evaluation/rule out neoplastic mast cell disorder.Risks and benefits of procedure was discussed with the patient  including, but not limited to bleeding, infection, damage to adjacent structures or low yield requiring additional tests.  All of the questions were answered and there is agreement to proceed.  Consent signed and in chart.    Thank you for allowing our service to participate in Douglas Nunez. 's care.  Electronically Signed: D. Honore Lux, PA-C   08/22/2023, 5:00 PM      I spent a total of  20 minutes   in face to face in clinical consultation,  greater than 50% of which was counseling/coordinating care for image guided bone marrow biopsy

## 2023-08-25 ENCOUNTER — Ambulatory Visit: Payer: Self-pay

## 2023-08-25 ENCOUNTER — Ambulatory Visit (INDEPENDENT_AMBULATORY_CARE_PROVIDER_SITE_OTHER)

## 2023-08-25 ENCOUNTER — Encounter (HOSPITAL_COMMUNITY): Payer: Self-pay

## 2023-08-25 ENCOUNTER — Ambulatory Visit (HOSPITAL_COMMUNITY)
Admission: RE | Admit: 2023-08-25 | Discharge: 2023-08-25 | Disposition: A | Discharge status: Home / Self Care | Source: Ambulatory Visit | Attending: Hematology

## 2023-08-25 ENCOUNTER — Other Ambulatory Visit: Payer: Self-pay

## 2023-08-25 DIAGNOSIS — Z7984 Long term (current) use of oral hypoglycemic drugs: Secondary | ICD-10-CM | POA: Diagnosis not present

## 2023-08-25 DIAGNOSIS — E119 Type 2 diabetes mellitus without complications: Secondary | ICD-10-CM | POA: Insufficient documentation

## 2023-08-25 DIAGNOSIS — D4709 Other mast cell neoplasms of uncertain behavior: Secondary | ICD-10-CM | POA: Insufficient documentation

## 2023-08-25 DIAGNOSIS — Z794 Long term (current) use of insulin: Secondary | ICD-10-CM | POA: Insufficient documentation

## 2023-08-25 DIAGNOSIS — Z91038 Other insect allergy status: Secondary | ICD-10-CM | POA: Insufficient documentation

## 2023-08-25 DIAGNOSIS — Z860101 Personal history of adenomatous and serrated colon polyps: Secondary | ICD-10-CM | POA: Insufficient documentation

## 2023-08-25 DIAGNOSIS — Z87892 Personal history of anaphylaxis: Secondary | ICD-10-CM | POA: Diagnosis not present

## 2023-08-25 DIAGNOSIS — Z85828 Personal history of other malignant neoplasm of skin: Secondary | ICD-10-CM | POA: Insufficient documentation

## 2023-08-25 DIAGNOSIS — Z85820 Personal history of malignant melanoma of skin: Secondary | ICD-10-CM | POA: Insufficient documentation

## 2023-08-25 DIAGNOSIS — Z1379 Encounter for other screening for genetic and chromosomal anomalies: Secondary | ICD-10-CM | POA: Insufficient documentation

## 2023-08-25 DIAGNOSIS — Z91030 Bee allergy status: Secondary | ICD-10-CM | POA: Insufficient documentation

## 2023-08-25 DIAGNOSIS — K219 Gastro-esophageal reflux disease without esophagitis: Secondary | ICD-10-CM | POA: Diagnosis not present

## 2023-08-25 DIAGNOSIS — M1990 Unspecified osteoarthritis, unspecified site: Secondary | ICD-10-CM | POA: Diagnosis not present

## 2023-08-25 DIAGNOSIS — I10 Essential (primary) hypertension: Secondary | ICD-10-CM | POA: Diagnosis not present

## 2023-08-25 LAB — CBC WITH DIFFERENTIAL/PLATELET
Abs Immature Granulocytes: 0.03 10*3/uL (ref 0.00–0.07)
Basophils Absolute: 0.1 10*3/uL (ref 0.0–0.1)
Basophils Relative: 1 %
Eosinophils Absolute: 0.2 10*3/uL (ref 0.0–0.5)
Eosinophils Relative: 4 %
HCT: 49.8 % (ref 39.0–52.0)
Hemoglobin: 16.3 g/dL (ref 13.0–17.0)
Immature Granulocytes: 1 %
Lymphocytes Relative: 26 %
Lymphs Abs: 1.4 10*3/uL (ref 0.7–4.0)
MCH: 30.8 pg (ref 26.0–34.0)
MCHC: 32.7 g/dL (ref 30.0–36.0)
MCV: 94.1 fL (ref 80.0–100.0)
Monocytes Absolute: 0.3 10*3/uL (ref 0.1–1.0)
Monocytes Relative: 5 %
Neutro Abs: 3.4 10*3/uL (ref 1.7–7.7)
Neutrophils Relative %: 63 %
Platelets: 208 10*3/uL (ref 150–400)
RBC: 5.29 MIL/uL (ref 4.22–5.81)
RDW: 12.9 % (ref 11.5–15.5)
WBC: 5.5 10*3/uL (ref 4.0–10.5)
nRBC: 0 % (ref 0.0–0.2)

## 2023-08-25 LAB — GLUCOSE, CAPILLARY: Glucose-Capillary: 136 mg/dL — ABNORMAL HIGH (ref 70–99)

## 2023-08-25 MED ORDER — FENTANYL CITRATE (PF) 100 MCG/2ML IJ SOLN
INTRAMUSCULAR | Status: AC | PRN
  Administered 2023-08-25: 50 ug via INTRAVENOUS

## 2023-08-25 MED ORDER — MIDAZOLAM HCL 2 MG/2ML IJ SOLN
INTRAMUSCULAR | Status: AC
  Filled 2023-08-25: qty 4

## 2023-08-25 MED ORDER — FLUMAZENIL 0.5 MG/5ML IV SOLN
INTRAVENOUS | Status: AC
  Filled 2023-08-25: qty 5

## 2023-08-25 MED ORDER — MIDAZOLAM HCL 2 MG/2ML IJ SOLN
INTRAMUSCULAR | Status: AC | PRN
  Administered 2023-08-25: 1 mg via INTRAVENOUS

## 2023-08-25 MED ORDER — FENTANYL CITRATE (PF) 100 MCG/2ML IJ SOLN
INTRAMUSCULAR | Status: AC
  Filled 2023-08-25: qty 2

## 2023-08-25 MED ORDER — SODIUM CHLORIDE 0.9 % IV SOLN: INTRAVENOUS | Status: DC

## 2023-08-25 MED ORDER — NALOXONE HCL 0.4 MG/ML IJ SOLN
INTRAMUSCULAR | Status: AC
  Filled 2023-08-25: qty 1

## 2023-08-25 MED ORDER — LIDOCAINE HCL 1 % IJ SOLN: INTRAMUSCULAR | Status: AC | PRN

## 2023-08-25 NOTE — Procedures (Signed)
 Vascular and Interventional Radiology Procedure Note  Patient: Douglas Nunez. DOB: [birth year] Medical Record Number: [id number] Note Date/Time: 08/25/23 8:46 AM   Performing Physician: Art Largo, MD Assistant(s): None  Diagnosis: Q mastocytosis   Procedure: BONE MARROW ASPIRATION and BIOPSY  Anesthesia: Conscious Sedation Complications: None Estimated Blood Loss: Minimal Specimens: Sent for Pathology  Findings:  Successful CT-guided bone marrow aspiration and biopsy A total of 1 cores were obtained. Hemostasis of the tract was achieved using Manual Pressure.  Plan: Bed rest for 1 hours.  See detailed procedure note with images in PACS. The patient tolerated the procedure well without incident or complication and was returned to Recovery in stable condition.    Art Largo, MD [facility] Radiology   Pager. [phone] Clinic. [phone]

## 2023-08-25 NOTE — Discharge Instructions (Signed)
 Please call Interventional Radiology clinic [phone] with any questions or concerns.  You may remove your dressing and shower tomorrow.   Bone Marrow Aspiration and Bone Marrow Biopsy, Adult, Care After This sheet gives you information about how to care for yourself after your procedure. Your health care provider may also give you more specific instructions. If you have problems or questions, contact your health care provider. What can I expect after the procedure? After the procedure, it is common to have: Mild pain and tenderness. Swelling. Bruising. Follow these instructions at home: Puncture site care Follow instructions from your health care provider about how to take care of the puncture site. Make sure you: Wash your hands with soap and water before and after you change your bandage (dressing). If soap and water are not available, use hand sanitizer. Change your dressing as told by your health care provider. Check your puncture site every day for signs of infection. Check for: More redness, swelling, or pain. Fluid or blood. Warmth. Pus or a bad smell.   Activity Return to your normal activities as told by your health care provider. Ask your health care provider what activities are safe for you. Do not lift anything that is heavier than 10 lb (4.5 kg), or the limit that you are told, until your health care provider says that it is safe. Do not drive for 24 hours if you were given a sedative during your procedure. General instructions Take over-the-counter and prescription medicines only as told by your health care provider. Do not take baths, swim, or use a hot tub until your health care provider approves. Ask your health care provider if you may take showers. You may only be allowed to take sponge baths. If directed, put ice on the affected area. To do this: Put ice in a plastic bag. Place a towel between your skin and the bag. Leave the ice on for 20 minutes, 2-3 times a  day. Keep all follow-up visits as told by your health care provider. This is important.   Contact a health care provider if: Your pain is not controlled with medicine. You have a fever. You have more redness, swelling, or pain around the puncture site. You have fluid or blood coming from the puncture site. Your puncture site feels warm to the touch. You have pus or a bad smell coming from the puncture site. Summary After the procedure, it is common to have mild pain, tenderness, swelling, and bruising. Follow instructions from your health care provider about how to take care of the puncture site and what activities are safe for you. Take over-the-counter and prescription medicines only as told by your health care provider. Contact a health care provider if you have any signs of infection, such as fluid or blood coming from the puncture site. This information is not intended to replace advice given to you by your health care provider. Make sure you discuss any questions you have with your health care provider. Document Revised: 07/14/2018 Document Reviewed: 07/14/2018 Elsevier Patient Education  2021 Elsevier Inc.   Moderate Conscious Sedation, Adult, Care After This sheet gives you information about how to care for yourself after your procedure. Your health care provider may also give you more specific instructions. If you have problems or questions, contact your health care provider. What can I expect after the procedure? After the procedure, it is common to have: Sleepiness for several hours. Impaired judgment for several hours. Difficulty with balance. Vomiting if you eat too  soon. Follow these instructions at home: For the time period you were told by your health care provider: Rest. Do not participate in activities where you could fall or become injured. Do not drive or use machinery. Do not drink alcohol. Do not take sleeping pills or medicines that cause drowsiness. Do not  make important decisions or sign legal documents. Do not take care of children on your own.      Eating and drinking Follow the diet recommended by your health care provider. Drink enough fluid to keep your urine pale yellow. If you vomit: Drink water, juice, or soup when you can drink without vomiting. Make sure you have little or no nausea before eating solid foods.   General instructions Take over-the-counter and prescription medicines only as told by your health care provider. Have a responsible adult stay with you for the time you are told. It is important to have someone help care for you until you are awake and alert. Do not smoke. Keep all follow-up visits as told by your health care provider. This is important. Contact a health care provider if: You are still sleepy or having trouble with balance after 24 hours. You feel light-headed. You keep feeling nauseous or you keep vomiting. You develop a rash. You have a fever. You have redness or swelling around the IV site. Get help right away if: You have trouble breathing. You have new-onset confusion at home. Summary After the procedure, it is common to feel sleepy, have impaired judgment, or feel nauseous if you eat too soon. Rest after you get home. Know the things you should not do after the procedure. Follow the diet recommended by your health care provider and drink enough fluid to keep your urine pale yellow. Get help right away if you have trouble breathing or new-onset confusion at home. This information is not intended to replace advice given to you by your health care provider. Make sure you discuss any questions you have with your health care provider. Document Revised: 06/25/2019 Document Reviewed: 01/21/2019 Elsevier Patient Education  2021 [employer].

## 2023-08-31 NOTE — Progress Notes (Signed)
 HEMATOLOGY/ONCOLOGY CLINIC NOTE  Date of Service: 09/01/2023  Patient Care Team: Cyndi Shaver, DEVONNA as PCP - General (Physician Assistant) Faythe Purchase, MD as Consulting Physician (Endocrinology)  CHIEF COMPLAINTS/PURPOSE OF CONSULTATION:  Evaluate for systemic mastocytosis  HISTORY OF PRESENTING ILLNESS:   Douglas Dunshee. is a wonderful 68 y.o. male who has been referred to us  Dr. Kozlow for further evaluation of possible Systemic mastocytosis.   He was last seen by Dr. Maurilio on 06/17/2023.   Patient presents with his wife during today's visit.   Patient noted to have anaphylaxis reaction to bee /wasp and yellow jacket venom. Patient reports that he has been stung by Yellow jackets/wasps/honeybees about 300 times. Patient reports that he is typically mostly stung by wasps and yellow jackets. He reports that his first reaction occurred 10-12 years ago due to wasp sting. Patient did not have any reaction issues prior to that time.   He reports that around 10-12 years ago, patient also began having itchiness in his arms and scalp as well as redness after taking two tablets of Aleve. He notes that he took Aleve due to back pain after cutting wood and showering.   Patient reports that he cut wood again 1 week later, and took 2 tablets of Aleve at that time to manage back pain. He endorsed the same symptoms of itchy arms and scalp with redness. Patient was then recommended by his general practitioner to stop taking anti-inflammatory medication. Patient does not take anti-inflamatory medication at this time.   He reports having another episode which he attributed to cooking with certain spices.   Patient reports an incident of being stung by a wasp in the temple, causing itchiness in his arms/scalp with redness. He denies any swelling with his symptoms.  His wife reports that she has needed to perform CPR on patient in the past and he has needed to present to the ED several times.    Patient denies working in a profession with frequent bee contact, though he does note living in an area close to nature.   Patient reports that he was previously on Trulicity for DM, which managed his blood sugars well. He reports that after about 3 months, at which time he would receive an injection once a week, he began endorsing explosive diarrhea two nights later and began going unconscious. Patient notes that he had the same symptoms the next week a couple days after taking Trulicity. Patient reports that he was thought to have a vasovagal reaction and received workup evaluating mast cells.   Patient is unsure if he has any baseline tryptase testing at a time without having a reaction.   His wife reports that patient did have an episode with flushing previously which was thought to be triggered by Metformin, which was then stopped.   Patient reports that he was last stung by a bee 4 weeks ago and took liquid benadryl .   At baseline, he is taking Claritin  10 MG twice daily, Famotidine  20 mg twice daily, and liquid benadryl  as needed. Patient denies taking Singulair or any Mast cell stabilizers.   Patient has been on antihistamine and Pepsid combination with Dr. Kozlow since about 2018.   Patient denies having any spontaneous large, full-body reactions outside of bee/wasp stings and anti-inflammatory medication, and Metformin.   Patient reports an incident in which he was working in a closet near a drain pipe scraping black mold in 95 degree heat. He reports feeling an insect bite, possibly  from mosquito or spider, though he did not see a welt on his arm. Patient reports feeling poorly once he finished his work, and eventually became unconscious before being able to inject himself with Epipen . He received CPR and was eventually injected with Epipen . Patient believes his syncope event was triggered either by insect bite or black mold.   Patient reports that he did previously receive steroids  at the hospital after having cardiac workup. He was not found to have any cardiac issues. His calcium  level were slightly elevated at that time.   He denies previously having any whole-body CT scan or bone marrow biopsy in the past.   Patient reports mild work-related arthritis. He denies any arthritis related to rheumatoid.   Patient reports having findings of increased reactivity overall to wasps, yellow jackets, and honey bees. I discussed that this could be a function of sensitization.   Patient denies any abdominal pain. Patient denies any hx of liver issues.   He reports lower back pain attributed to work.   Patient denies any unexplained fever, chills, night sweats, or sudden weight loss. Patient denies any fixed skin rashes.   Patient reports that he began desensitization injection therapy with Dr. Maurilio 3 weeks ago.   INTERVAL HISTORY:  Douglas Nunez. Is a 68 y.o. male here for continued evaluation and management of mast cell disorder.   He was initially seen by me on 07/18/2023 and reported having anaphylaxis reaction to bee/wasp and yellow jacket venom, mild arthritis, lower back pain, and prior syncope event possibly due to insect bite or black mold exposure. He also noted previous flushing attributed to Metformin which was stopped.    MEDICAL HISTORY:  Past Medical History:  Diagnosis Date   Allergy     Anaphylaxis due to hymenoptera venom    Cataract 2016, 2017   bilateral extractions with lens implant bilaterally   Compression fracture of thoracic vertebra (HCC) T11    seen on CT   Degenerative joint disease (DJD) of lumbar spine    Diabetes mellitus without complication (HCC)    GERD (gastroesophageal reflux disease)    Hx of adenomatous colonic polyps 10/07/2018   Hypertension    Mast cell activation syndrome (HCC)    Melanoma (HCC)    Skin cancer    several places removed, and one Moths surgery on right temple   Vasovagal syncope 2018   associated with  diarrhea, hyperglycemia    SURGICAL HISTORY: Past Surgical History:  Procedure Laterality Date   APPENDECTOMY     age 30   CATARACT EXTRACTION Bilateral    and lens implanted   KNEE SURGERY Left 1985   LASIK Bilateral    MOHS SURGERY Right    temple   RETINAL DETACHMENT SURGERY Right    SKIN BIOPSY     multiple skin cancers removed    SOCIAL HISTORY: Social History   Socioeconomic History   Marital status: Married    Spouse name: Not on file   Number of children: 2   Years of education: Not on file   Highest education level: Associate degree: academic program  Occupational History   Occupation: Teaching laboratory technician: [employer]    Comment: Holiday representative  Tobacco Use   Smoking status: Never    Passive exposure: Never   Smokeless tobacco: Never  Vaping Use   Vaping status: Not on file  Substance and Sexual Activity   Alcohol use: Yes    Alcohol/week: 0.0  standard drinks of alcohol    Comment: occ   Drug use: No   Sexual activity: Not on file  Other Topics Concern   Not on file  Social History Narrative   Married to [name]   Never smoker   Rare EtOH   No drugs   Social Drivers of Corporate investment banker Strain: Low Risk  (07/15/2023)   Overall Financial Resource Strain (CARDIA)    Difficulty of Paying Living Expenses: Not hard at all  Food Insecurity: No Food Insecurity (07/15/2023)   Hunger Vital Sign    Worried About Running Out of Food in the Last Year: Never true    Ran Out of Food in the Last Year: Never true  Transportation Needs: No Transportation Needs (07/15/2023)   PRAPARE - Administrator, Civil Service (Medical): No    Lack of Transportation (Non-Medical): No  Physical Activity: Insufficiently Active (07/15/2023)   Exercise Vital Sign    Days of Exercise per Week: 2 days    Minutes of Exercise per Session: 60 min  Stress: No Stress Concern Present (07/15/2023)   [employer] of Occupational Health -  Occupational Stress Questionnaire    Feeling of Stress : Not at all  Social Connections: Unknown (07/15/2023)   Social Connection and Isolation Panel    Frequency of Communication with Friends and Family: More than three times a week    Frequency of Social Gatherings with Friends and Family: More than three times a week    Attends Religious Services: Patient declined    Database administrator or Organizations: Patient declined    Attends Engineer, structural: More than 4 times per year    Marital Status: Married  Catering manager Violence: Not At Risk (07/15/2023)   Humiliation, Afraid, Rape, and Kick questionnaire    Fear of Current or Ex-Partner: No    Emotionally Abused: No    Physically Abused: No    Sexually Abused: No    FAMILY HISTORY: Family History  Problem Relation Age of Onset   Allergic rhinitis Mother    Lung cancer Mother        light smoker   Diabetes Father    Heart attack Father        multiple    Cancer - Colon Neg Hx    Esophageal cancer Neg Hx    Rectal cancer Neg Hx     ALLERGIES:  is allergic to bee venom, trulicity [dulaglutide], metformin hcl, nsaids, and victoza [liraglutide].  MEDICATIONS:  Current Outpatient Medications  Medication Sig Dispense Refill   amLODipine  (NORVASC ) 5 MG tablet Take 5 mg every morning by mouth.      aspirin  81 MG tablet Take 81 mg by mouth daily.     BD PEN NEEDLE NANO U/F 32G X 4 MM MISC USE ONCE DAILY TO ADMINSTER INSULIN  ONCE A DAY for 90 days     Cholecalciferol (VITAMIN D ) 50 MCG (2000 UT) tablet      Cyanocobalamin (VITAMIN B 12 PO)      empagliflozin  (JARDIANCE ) 25 MG TABS tablet Take 25 mg by mouth daily.      EPINEPHrine  0.3 mg/0.3 mL IJ SOAJ injection Inject 0.3 mg into the muscle as needed. 2 each 1   Evolocumab  (REPATHA  SURECLICK) 140 MG/ML SOAJ Inject 140 mg into the skin every 14 (fourteen) days. 6 mL 3   famotidine  (PEPCID ) 20 MG tablet Take 1 tablet (20 mg total) by mouth 2 (two) times daily.  180  tablet 1   Insulin  Glargine (BASAGLAR  KWIKPEN) 100 UNIT/ML Inject 40 Units into the skin daily.     loratadine  (CLARITIN ) 10 MG tablet Take 1 tablet (10 mg total) by mouth 2 (two) times daily. 180 tablet 1   losartan  (COZAAR ) 25 MG tablet Take 25 mg by mouth daily.     ONETOUCH VERIO test strip 3 (three) times daily.     terbinafine  (LAMISIL ) 250 MG tablet Take 1 tablet (250 mg total) by mouth daily. 84 tablet 0   No current facility-administered medications for this visit.    REVIEW OF SYSTEMS:    10 Point review of Systems was done is negative except as noted above.  PHYSICAL EXAMINATION: ECOG PERFORMANCE STATUS: 1 - Symptomatic but completely ambulatory  . Vitals:   09/01/23 1340  BP: (!) 144/75  Pulse: 73  Resp: 20  Temp: (!) 97.5 F (36.4 C)  SpO2: 98%    Filed Weights   09/01/23 1340  Weight: 191 lb 8 oz (86.9 kg)    .Body mass index is 27.87 kg/m.  GENERAL:alert, in no acute distress and comfortable SKIN: no acute rashes, no significant lesions EYES: conjunctiva are pink and non-injected, sclera anicteric OROPHARYNX: MMM, no exudates, no oropharyngeal erythema or ulceration NECK: supple, no JVD LYMPH:  no palpable lymphadenopathy in the cervical, axillary or inguinal regions LUNGS: clear to auscultation b/l with normal respiratory effort HEART: regular rate & rhythm ABDOMEN:  normoactive bowel sounds , non tender, not distended. Extremity: no pedal edema PSYCH: alert & oriented x 3 with fluent speech NEURO: no focal motor/sensory deficits   LABORATORY DATA:  I have reviewed the data as listed  .    Latest Ref Rng & Units 08/25/2023    7:46 AM 07/18/2023    1:17 PM 11/02/2022    2:15 PM  CBC  WBC 4.0 - 10.5 K/uL 5.5  4.9  8.1   Hemoglobin 13.0 - 17.0 g/dL 83.6  84.4  84.4   Hematocrit 39.0 - 52.0 % 49.8  46.0  46.1   Platelets 150 - 400 K/uL 208  215  251     .    Latest Ref Rng & Units 07/18/2023    1:17 PM 05/30/2023    3:04 PM 11/02/2022    2:15  PM  CMP  Glucose 70 - 99 mg/dL 85  794  788   BUN 8 - 23 mg/dL 18  19  19    Creatinine 0.61 - 1.24 mg/dL 9.08  8.90  8.78   Sodium 135 - 145 mmol/L 143  142  140   Potassium 3.5 - 5.1 mmol/L 4.4  4.5  3.4   Chloride 98 - 111 mmol/L 108  105  109   CO2 22 - 32 mmol/L 29  21  19    Calcium  8.9 - 10.3 mg/dL 9.6  9.4  8.9   Total Protein 6.5 - 8.1 g/dL 7.7  7.1    Total Bilirubin 0.0 - 1.2 mg/dL 0.7  0.3    Alkaline Phos 38 - 126 U/L 68  83    AST 15 - 41 U/L 27  23    ALT 0 - 44 U/L 22  21     08/25/2023 Bone Marrow Aspirate:         Component     Latest Ref Rng 07/18/2023  IgG (Immunoglobin G), Serum     603 - 1,613 mg/dL 8,732   IgA     61 - 562 mg/dL  329   IgM (Immunoglobulin M), Srm     20 - 172 mg/dL 879   Total Protein ELP     6.0 - 8.5 g/dL 7.1 (C)  Albumin SerPl Elph-Mcnc     2.9 - 4.4 g/dL 3.8 (C)  Alpha 1     0.0 - 0.4 g/dL 0.2 (C)  Alpha2 Glob SerPl Elph-Mcnc     0.4 - 1.0 g/dL 0.8 (C)  B-Globulin SerPl Elph-Mcnc     0.7 - 1.3 g/dL 1.0 (C)  Gamma Glob SerPl Elph-Mcnc     0.4 - 1.8 g/dL 1.3 (C)  M Protein SerPl Elph-Mcnc     Not Observed g/dL Not Observed (C)  Globulin, Total     2.2 - 3.9 g/dL 3.3 (C)  Albumin/Glob SerPl     0.7 - 1.7  1.2 (C)  IFE 1 Comment (C)  Please Note (HCV): Comment (C)  Kappa free light chain     3.3 - 19.4 mg/L 23.8 (H)   Lambda free light chains     5.7 - 26.3 mg/L 16.6   Kappa, lambda light chain ratio     0.26 - 1.65  1.43   Tryptase     2.2 - 13.2 ug/L 18.4 (H)   LDH     98 - 192 U/L 153     Legend: (H) High (C) Corrected  RADIOGRAPHIC STUDIES: I have personally reviewed the radiological images as listed and agreed with the findings in the report. CT BONE MARROW BIOPSY & ASPIRATION Result Date: 08/25/2023 INDICATION: Unilateral bone marrow aspiration and biopsy to evaluate for mastocytosis. EXAM: CT GUIDED BONE MARROW ASPIRATION AND CORE BIOPSY MEDICATIONS: None. ANESTHESIA/SEDATION: Moderate (conscious) sedation  was employed during this procedure. A total of Versed  2 mg and Fentanyl  100 mcg was administered intravenously. Moderate Sedation Time: 12 minutes. The patient's level of consciousness and vital signs were monitored continuously by radiology nursing throughout the procedure under my direct supervision. FLUOROSCOPY TIME:  CT dose; 160 mGycm COMPLICATIONS: None immediate. Estimated blood loss: <5 mL PROCEDURE: RADIATION DOSE REDUCTION: This exam was performed according to the departmental dose-optimization program which includes automated exposure control, adjustment of the mA and/or kV according to patient size and/or use of iterative reconstruction technique. Informed written consent was obtained from the patient after a thorough discussion of the procedural risks, benefits and alternatives. All questions were addressed. Maximal Sterile Barrier Technique was utilized including caps, mask, sterile gowns, sterile gloves, sterile drape, hand hygiene and skin antiseptic. A timeout was performed prior to the initiation of the procedure. The patient was positioned prone and non-contrast localization CT was performed of the pelvis to demonstrate the iliac marrow spaces. Under sterile conditions and local anesthesia, an 11 gauge coaxial bone biopsy needle was advanced into the RIGHT iliac marrow space. Needle position was confirmed with CT imaging. Initially, bone marrow aspiration was performed. Next, the 11 gauge outer cannula was utilized to obtain a 1 iliac bone marrow core biopsy. Needle was removed. Hemostasis was obtained with compression. The patient tolerated the procedure well. Samples were prepared with the cytotechnologist. IMPRESSION: Successful CT-guided bone marrow aspiration and biopsy. Thom Hall, MD [facility] Electronically Signed   By: Thom Hall M.D.   On: 08/25/2023 10:38   CT CHEST ABDOMEN PELVIS WO CONTRAST (Accession [id number]) (Order  [id number]) Imaging Date: 08/01/2023 Department: [facility]-CT IMAG[facility] Released By: Jarold Tanda CROME Authorizing: Onesimo Emaline Brink, MD   Exam Status  Status  Final [99]   PACS Intelerad Image Link   Show images for CT CHEST ABDOMEN PELVIS WO CONTRAST Study Result  Narrative & Impression  CLINICAL DATA:  Mastocytosis, evaluate for lesion. * Tracking Code: BO *   EXAM: CT CHEST, ABDOMEN AND PELVIS WITHOUT CONTRAST   TECHNIQUE: Multidetector CT imaging of the chest, abdomen and pelvis was performed following the standard protocol without IV contrast.   RADIATION DOSE REDUCTION: This exam was performed according to the departmental dose-optimization program which includes automated exposure control, adjustment of the mA and/or kV according to patient size and/or use of iterative reconstruction technique.   COMPARISON:  Multiple priors including CT September 06, 2016.   FINDINGS: CT CHEST FINDINGS   Cardiovascular: Aortic atherosclerosis. Coronary artery calcifications. Normal size heart. No significant pericardial effusion/thickening.   Mediastinum/Nodes: No suspicious thyroid  nodule. No pathologically enlarged mediastinal, hilar or axillary lymph nodes. Small hiatal hernia.   Lungs/Pleura: Bilateral scattered pulmonary nodules measure up to 9 mm in the right middle lobe on image 75/4 are stable from CT October 25, 2018 compatible with a benign finding.   Solid 5 mm pulmonary nodule in the right lung apex on image 31/4 was outside of the field of view on examination from 2020.   Musculoskeletal: No aggressive lytic or blastic lesion of bone. Diffuse demineralization of bone.   CT ABDOMEN PELVIS FINDINGS   Hepatobiliary: Unremarkable noncontrast enhanced appearance of the hepatic parenchyma. Gallbladder is unremarkable. No biliary ductal dilation.   Pancreas: No pancreatic ductal dilation or evidence of acute inflammation.   Spleen: No  splenomegaly.   Adrenals/Urinary Tract: 9 mm right adrenal nodule on image 47/2 is stable dating back to September 06, 2016 compatible with a benign adenoma in requiring no independent imaging follow-up. Nodularity of the genu of the left adrenal gland measuring 9 mm on image 51/2 is also stable dating back to 2018 compatible with a benign adenoma and requiring no independent imaging follow-up.   Masslike asymmetry in the left kidney which distorts the renal contour measures 2.8 cm on image 62/2 it Hounsfield units of 29, this reflects a cortical outpouching on contrast enhanced CT September 06, 2016 compatible with a benign finding.   Bilateral renal lesions which measure fluid density are compatible with cysts and considered benign requiring no independent imaging follow-up. Additional tiny bilateral hypodensities are technically too small to accurately characterize but statistically likely reflect cysts and considered benign requiring no independent imaging follow-up.   Nonobstructive 1 mm right interpolar renal stone.   Urinary bladder is unremarkable for degree of distension.   Stomach/Bowel: Tiny hiatal hernia. Colonic diverticulosis. No evidence of acute bowel inflammation or obstruction.   Vascular/Lymphatic: Aortic atherosclerosis. Normal caliber abdominal aorta. Retroaortic left renal vein. No pathologically enlarged abdominal or pelvic lymph nodes.   Reproductive: Prostate is unremarkable.   Other: No significant abdominopelvic free fluid.   Musculoskeletal: Sclerotic lesion in the anterior L4 vertebral body is favored a benign bone island. Diffuse demineralization of bone. Multilevel degenerative changes spine.   IMPRESSION: 1. Diffuse demineralization of the lumbar spine may reflect osteopenia or a marrow replacement process. 2. Sclerotic foci in the L4 vertebral body are favored benign bone islands. 3. No discrete suspicious mass or lymphadenopathy in the  chest, abdomen or pelvis on this noncontrast enhanced examination. 4. Solid 5 mm pulmonary nodule in the right lung apex was outside of the field of view on examination from 2020. Consider follow-up chest CT in 6-12 months. 5. Nonobstructive 1 mm right  interpolar renal stone. 6. Colonic diverticulosis without evidence of acute bowel inflammation. 7.  Aortic Atherosclerosis (ICD10-I70.0).     Electronically Signed   By: Reyes Holder M.D.   On: 08/04/2023 08:40    ASSESSMENT & PLAN:  69 y.o. male with:  R/o Systemic mastocytosis in the conext of recurrent anaphylactic reactions and elevated tryptase levels. Anaphylaxis reaction to bee/wasp and Yellow Jacket venom  PLAN:  -discussed lab results from 07/18/2023 in detail with patient -CBC showed WBC 4.9K. hemoglobin 15.5, and platelets 215K CT CAP with no evidence of Systemic mastocytosis related lesions.  Incodentally noted 5mm RUL lung nodule which need rpt CT chest in 6-12 months. -he received CT bone marrow biopsy and aspiration on 08/25/2023 -Bone marrow aspiration on 08/25/2023 showed normocellular bone marrow (50%) with trilineage hematopoiesis and increased mast cells  - KIT mutation test results pending -CYt[employer]etics pending.  FOLLOW-UP: Will call patient with remaining BM Bx results   ADDENDUM     -discussed BM Bx results with Pathology Dr Ilsa Pottier. Findings do not meet major criteria for systemic mastocytosis. She will add Cd25 testing and possibly Cd2 testing. Tryptase has been >20 but recently slightly lower . Most tryptase levels have been in the context of Bee/wasp sting with acute Anaphylactic reactions. So will need to monitor rpt baseline levels.  No evidence of pancytopenias/LNadenopathy/Hepatosplenomegaly or other focal lesions on CT CAP or other focal systems to suggest aggressive Systemic Mastocytosis or SM with AHD  -Called patient and discussed all these additional results.  -ASSESSMENT-   Overall BM , lab and imaging findings not definitive for Systemic Mastocytosis (does not meet criteria for Indolent SM and has no aggressive features)  -KIT mutation neg  -CD25 pending to check if there is abnormal antigen expression suggestive of Monoclonal Mast Cell activation Syndrome vs Secondary Mast cell activation. PLAN - discussed in details the avoidance of all mast cell activation tiggers - continue H1+H2  blockage as per Dr Maurilio. -would also recommend adding a Leukotriene inhibitor. -always carrying atleast 2 epi-pens --which he does. -no indication for use of KIT directed TKI such as Midostaurin in the absence of KIT mutation -would recommend referral to [facility] Dr Manuelita Manes (M[facility] cell [facility] of [facility]) for additional input) - unclear utility/role for antigen desensitization in this context. - there also may be consideration for use of Omalizumab if recurrence severe IgE mediated events and if IgE levels are especially increased...will defer this decision to Dr Maurilio. -RTC with Dr Onesimo with labs in 4 months  The total time spent in the appointment was 45 minutes* .  All of the patient's questions were answered with apparent satisfaction. The patient knows to call the clinic with any problems, questions or concerns.   Emaline Onesimo MD MS AAHIVMS [facility] Hematology/Oncology Physician [facility]  .*Total Encounter Time as defined by the Centers for Medicare and Medicaid Services includes, in addition to the face-to-face time of a patient visit (documented in the note above) non-face-to-face time: obtaining and reviewing outside history, ordering and reviewing medications, tests or procedures, care coordination (communications with other health care professionals or caregivers) and documentation in the medical record.    I,Mitra Faeizi,acting as a Neurosurgeon for Emaline Onesimo, MD.,have documented all relevant documentation on the behalf of Emaline Onesimo,  MD,as directed by  Emaline Onesimo, MD while in the presence of Emaline Onesimo, MD.  .I have reviewed the above documentation for accuracy and completeness, and I agree with the above. [name]  Emaline Candida Saran MD

## 2023-09-01 ENCOUNTER — Inpatient Hospital Stay: Attending: Hematology | Admitting: Hematology

## 2023-09-01 ENCOUNTER — Ambulatory Visit (INDEPENDENT_AMBULATORY_CARE_PROVIDER_SITE_OTHER)

## 2023-09-01 VITALS — BP 144/75 | HR 73 | Temp 97.5°F | Resp 20 | Wt 191.5 lb

## 2023-09-01 DIAGNOSIS — Z91038 Other insect allergy status: Secondary | ICD-10-CM

## 2023-09-01 DIAGNOSIS — D4709 Other mast cell neoplasms of uncertain behavior: Secondary | ICD-10-CM

## 2023-09-01 DIAGNOSIS — R911 Solitary pulmonary nodule: Secondary | ICD-10-CM | POA: Insufficient documentation

## 2023-09-01 DIAGNOSIS — D8940 Mast cell activation, unspecified: Secondary | ICD-10-CM | POA: Insufficient documentation

## 2023-09-03 ENCOUNTER — Encounter (HOSPITAL_COMMUNITY): Payer: Self-pay | Admitting: Hematology

## 2023-09-05 ENCOUNTER — Encounter (HOSPITAL_COMMUNITY): Payer: Self-pay

## 2023-09-08 ENCOUNTER — Ambulatory Visit (INDEPENDENT_AMBULATORY_CARE_PROVIDER_SITE_OTHER)

## 2023-09-08 DIAGNOSIS — Z91038 Other insect allergy status: Secondary | ICD-10-CM

## 2023-09-15 ENCOUNTER — Ambulatory Visit (INDEPENDENT_AMBULATORY_CARE_PROVIDER_SITE_OTHER)

## 2023-09-15 ENCOUNTER — Ambulatory Visit (AMBULATORY_SURGERY_CENTER)

## 2023-09-15 VITALS — Ht 69.0 in | Wt 186.0 lb

## 2023-09-15 DIAGNOSIS — Z91038 Other insect allergy status: Secondary | ICD-10-CM

## 2023-09-15 DIAGNOSIS — Z860100 Personal history of colon polyps, unspecified: Secondary | ICD-10-CM

## 2023-09-15 MED ORDER — NA SULFATE-K SULFATE-MG SULF 17.5-3.13-1.6 GM/177ML PO SOLN: 1.0000 | Freq: Once | ORAL | 0 refills | Status: AC

## 2023-09-15 NOTE — Progress Notes (Signed)

## 2023-09-16 LAB — SURGICAL PATHOLOGY

## 2023-09-17 ENCOUNTER — Other Ambulatory Visit: Payer: Self-pay

## 2023-09-17 DIAGNOSIS — D4709 Other mast cell neoplasms of uncertain behavior: Secondary | ICD-10-CM

## 2023-09-18 ENCOUNTER — Telehealth: Payer: Self-pay | Admitting: Hematology

## 2023-09-18 NOTE — Telephone Encounter (Signed)
 Left patient a vm regarding upcoming appointment

## 2023-09-22 ENCOUNTER — Ambulatory Visit (INDEPENDENT_AMBULATORY_CARE_PROVIDER_SITE_OTHER)

## 2023-09-22 DIAGNOSIS — Z91038 Other insect allergy status: Secondary | ICD-10-CM

## 2023-09-29 ENCOUNTER — Ambulatory Visit (INDEPENDENT_AMBULATORY_CARE_PROVIDER_SITE_OTHER)

## 2023-09-29 DIAGNOSIS — Z91038 Other insect allergy status: Secondary | ICD-10-CM

## 2023-10-06 ENCOUNTER — Ambulatory Visit (INDEPENDENT_AMBULATORY_CARE_PROVIDER_SITE_OTHER)

## 2023-10-06 DIAGNOSIS — Z91038 Other insect allergy status: Secondary | ICD-10-CM | POA: Diagnosis not present

## 2023-10-08 ENCOUNTER — Telehealth: Payer: Self-pay | Admitting: Internal Medicine

## 2023-10-08 DIAGNOSIS — Z860100 Personal history of colon polyps, unspecified: Secondary | ICD-10-CM

## 2023-10-08 MED ORDER — NA SULFATE-K SULFATE-MG SULF 17.5-3.13-1.6 GM/177ML PO SOLN: 1.0000 | Freq: Once | ORAL | 0 refills | Status: AC

## 2023-10-08 NOTE — Telephone Encounter (Signed)
 Suprep sent via [facility] on 7/7.  Resent RX today.  Pt called and informed.  Patient states he hasn't checked with the pharmacy, but they have not texted him to let him know a RX was ready so he assumed it wasn't called in.  RN instructed patient to pick up his RX today and if he had any issues, to call [facility] back, he verbalized understanding.

## 2023-10-08 NOTE — Telephone Encounter (Signed)
 Inbound call from patient stating he hasn't received his prep medication for his upcoming procedure 8/4. Would like it sent to [facility] [address], [place] [address]    Please advise  Thank you

## 2023-10-12 ENCOUNTER — Encounter: Payer: Self-pay | Admitting: Internal Medicine

## 2023-10-12 NOTE — Progress Notes (Unsigned)
 [facility] History and Physical   Primary Care Physician:  Cyndi Shaver, PA-C   Reason for Procedure:  History of adenomatous colon polyps  Plan:    Colonoscopy     HPI: Douglas Nunez. is a 68 y.o. male status post removal of 3 diminutive adenomas by colonoscopy in 2020.   Past Medical History:  Diagnosis Date   Allergy     Anaphylaxis due to hymenoptera venom    Cataract 2016, 2017   bilateral extractions with lens implant bilaterally   Compression fracture of thoracic vertebra (HCC) T11    seen on CT   Degenerative joint disease (DJD) of lumbar spine    Diabetes mellitus without complication (HCC)    GERD (gastroesophageal reflux disease)    Hx of adenomatous colonic polyps 10/07/2018   Hyperlipidemia    Hypertension    Mast cell activation syndrome (HCC)    Melanoma (HCC)    Skin cancer    several places removed, and one Moths surgery on right temple   Vasovagal syncope 2018   associated with diarrhea, hyperglycemia    Past Surgical History:  Procedure Laterality Date   APPENDECTOMY     age 24   CATARACT EXTRACTION Bilateral    and lens implanted   COLONOSCOPY     KNEE SURGERY Left 1985   LASIK Bilateral    MOHS SURGERY Right    temple   RETINAL DETACHMENT SURGERY Right    SKIN BIOPSY     multiple skin cancers removed    Prior to Admission medications   Medication Sig Start Date End Date Taking? Authorizing Provider  amLODipine  (NORVASC ) 5 MG tablet Take 5 mg every morning by mouth.     [provider]  aspirin  81 MG tablet Take 81 mg by mouth daily.    [provider]  BD PEN NEEDLE NANO U/F 32G X 4 MM MISC USE ONCE DAILY TO ADMINSTER INSULIN  ONCE A DAY for 90 days    [provider]  Cholecalciferol (VITAMIN D ) 50 MCG (2000 UT) tablet     [provider]  Cyanocobalamin (VITAMIN B 12 PO)     [provider]  empagliflozin  (JARDIANCE ) 25 MG TABS tablet Take 25 mg by mouth daily.      [provider]  EPINEPHrine  0.3 mg/0.3 mL IJ SOAJ injection Inject 0.3 mg into the muscle as needed. 06/17/23   Kozlow, Camellia PARAS, MD  Evolocumab  (REPATHA  SURECLICK) 140 MG/ML SOAJ Inject 140 mg into the skin every 14 (fourteen) days. 08/21/23   Cyndi Shaver, PA-C  famotidine  (PEPCID ) 20 MG tablet Take 1 tablet (20 mg total) by mouth 2 (two) times daily. 06/17/23   Kozlow, Camellia PARAS, MD  Insulin  Glargine (BASAGLAR  KWIKPEN) 100 UNIT/ML Inject 40 Units into the skin daily. 07/31/20   [provider]  loratadine  (CLARITIN ) 10 MG tablet Take 1 tablet (10 mg total) by mouth 2 (two) times daily. 06/17/23   Kozlow, Camellia PARAS, MD  losartan  (COZAAR ) 25 MG tablet Take 25 mg by mouth daily. Patient not taking: Reported on 09/15/2023 06/11/20   [provider]  losartan  (COZAAR ) 50 MG tablet Take 50 mg by mouth daily.    [provider]  [facility] VERIO test strip 3 (three) times daily. 11/09/19   [provider]  terbinafine  (LAMISIL ) 250 MG tablet Take 1 tablet (250 mg total) by mouth daily. Patient not taking: Reported on 09/15/2023 06/11/23   Cyndi Shaver, PA-C    Current Outpatient Medications  Medication Sig Dispense Refill   amLODipine  (NORVASC ) 5 MG tablet Take 5 mg every morning by mouth.      aspirin  81 MG tablet Take 81 mg by mouth daily.     Cholecalciferol (VITAMIN D ) 50 MCG (2000 UT) tablet      Cyanocobalamin (VITAMIN B 12 PO)      empagliflozin  (JARDIANCE ) 25 MG TABS tablet Take 25 mg by mouth daily.      famotidine  (PEPCID ) 20 MG tablet Take 1 tablet (20 mg total) by mouth 2 (two) times daily. 180 tablet 1   Insulin  Glargine (BASAGLAR  KWIKPEN) 100 UNIT/ML Inject 40 Units into the skin daily.     loratadine  (CLARITIN ) 10 MG tablet Take 1 tablet (10 mg total) by mouth 2 (two) times daily. 180 tablet 1   losartan  (COZAAR ) 50 MG tablet Take 50 mg by mouth daily.     BD PEN NEEDLE NANO U/F 32G X 4 MM MISC USE ONCE DAILY TO ADMINSTER INSULIN  ONCE A DAY for 90 days      EPINEPHrine  0.3 mg/0.3 mL IJ SOAJ injection Inject 0.3 mg into the muscle as needed. 2 each 1   Evolocumab  (REPATHA  SURECLICK) 140 MG/ML SOAJ Inject 140 mg into the skin every 14 (fourteen) days. 6 mL 3   ONETOUCH VERIO test strip 3 (three) times daily.     Current Facility-Administered Medications  Medication Dose Route Frequency Provider Last Rate Last Admin   0.9 %  sodium chloride  infusion  500 mL Intravenous Once Avram Lupita BRAVO, MD        Allergies as of 10/13/2023 - Review Complete 10/13/2023  Allergen Reaction Noted   Bee venom Anaphylaxis 04/16/2014   Trulicity [dulaglutide] Diarrhea and Nausea Only 08/24/2018   Metformin hcl Other (See Comments) 06/17/2023   Nsaids Itching and Rash 04/16/2014   Victoza [liraglutide] Nausea Only 08/08/2020    Family History  Problem Relation Age of Onset   Allergic rhinitis Mother    Lung cancer Mother        light smoker   Diabetes Father    Heart attack Father        multiple    Cancer - Colon Neg Hx    Esophageal cancer Neg Hx    Rectal cancer Neg Hx    Stomach cancer Neg Hx    Colon cancer Neg Hx     Social History   Socioeconomic History   Marital status: Married    Spouse name: Not on file   Number of children: 2   Years of education: Not on file   Highest education level: Associate degree: academic program  Occupational History   Occupation: Teaching laboratory technician: [employer]    Comment: Holiday representative  Tobacco Use   Smoking status: Never    Passive exposure: Never   Smokeless tobacco: Never  Vaping Use   Vaping status: Never Used  Substance and Sexual Activity   Alcohol use: Yes    Alcohol/week: 0.0 standard drinks of alcohol    Comment: occ   Drug use: No   Sexual activity: Not on file  Other Topics Concern   Not on file  Social History Narrative   Married to [name]   Never smoker   Rare EtOH   No drugs   Social Drivers of Corporate investment banker Strain: Low Risk   (07/15/2023)   Overall Financial Resource Strain (CARDIA)    Difficulty of Paying Living Expenses: Not hard at  all  Food Insecurity: No Food Insecurity (07/15/2023)   Hunger Vital Sign    Worried About Running Out of Food in the Last Year: Never true    Ran Out of Food in the Last Year: Never true  Transportation Needs: No Transportation Needs (07/15/2023)   PRAPARE - Administrator, Civil Service (Medical): No    Lack of Transportation (Non-Medical): No  Physical Activity: Insufficiently Active (07/15/2023)   Exercise Vital Sign    Days of Exercise per Week: 2 days    Minutes of Exercise per Session: 60 min  Stress: No Stress Concern Present (07/15/2023)   [employer] of Occupational Health - Occupational Stress Questionnaire    Feeling of Stress : Not at all  Social Connections: Unknown (07/15/2023)   Social Connection and Isolation Panel    Frequency of Communication with Friends and Family: More than three times a week    Frequency of Social Gatherings with Friends and Family: More than three times a week    Attends Religious Services: Patient declined    Database administrator or Organizations: Patient declined    Attends Engineer, structural: More than 4 times per year    Marital Status: Married  Catering manager Violence: Not At Risk (07/15/2023)   Humiliation, Afraid, Rape, and Kick questionnaire    Fear of Current or Ex-Partner: No    Emotionally Abused: No    Physically Abused: No    Sexually Abused: No    Review of Systems:  All other review of systems negative except as mentioned in the HPI.  Physical Exam: Vital signs BP 126/76   Pulse 60   Temp 97.8 F (36.6 C)   Ht 5' 9 (1.753 m)   Wt 186 lb (84.4 kg)   SpO2 98%   BMI 27.47 kg/m   General:   Alert,  Well-developed, well-nourished, pleasant and cooperative in NAD Lungs:  Clear throughout to auscultation.   Heart:  Regular rate and rhythm; no murmurs, clicks, rubs,  or gallops. Abdomen:   Soft, nontender and nondistended. Normal bowel sounds.   Neuro/Psych:  Alert and cooperative. Normal mood and affect. A and O x 3   @Douglas Nunez  CHARLENA Commander, MD, [facility] [phone] (pager) 10/13/2023 8:43 AM@

## 2023-10-13 ENCOUNTER — Encounter: Payer: Self-pay | Admitting: Internal Medicine

## 2023-10-13 ENCOUNTER — Ambulatory Visit (INDEPENDENT_AMBULATORY_CARE_PROVIDER_SITE_OTHER)

## 2023-10-13 ENCOUNTER — Ambulatory Visit (AMBULATORY_SURGERY_CENTER): Admitting: Internal Medicine

## 2023-10-13 ENCOUNTER — Ambulatory Visit

## 2023-10-13 VITALS — BP 110/62 | HR 67 | Temp 97.8°F | Resp 14 | Ht 69.0 in | Wt 186.0 lb

## 2023-10-13 DIAGNOSIS — Z860100 Personal history of colon polyps, unspecified: Secondary | ICD-10-CM

## 2023-10-13 DIAGNOSIS — Z860101 Personal history of adenomatous and serrated colon polyps: Secondary | ICD-10-CM

## 2023-10-13 DIAGNOSIS — K5730 Diverticulosis of large intestine without perforation or abscess without bleeding: Secondary | ICD-10-CM

## 2023-10-13 DIAGNOSIS — Z1211 Encounter for screening for malignant neoplasm of colon: Secondary | ICD-10-CM

## 2023-10-13 DIAGNOSIS — Z91038 Other insect allergy status: Secondary | ICD-10-CM | POA: Diagnosis not present

## 2023-10-13 DIAGNOSIS — K644 Residual hemorrhoidal skin tags: Secondary | ICD-10-CM

## 2023-10-13 MED ORDER — SODIUM CHLORIDE 0.9 % IV SOLN: 500.0000 mL | Freq: Once | INTRAVENOUS | Status: DC

## 2023-10-13 NOTE — Patient Instructions (Addendum)
 No polyps found today (and no cancer).  You do have diverticulosis - thickened muscle rings and pouches in the colon wall. Please read the handout about this condition.  Also a swollen external hemorrhoid as you know.  Repeat exam 7 years.  I appreciate the opportunity to care for you. Lupita CHARLENA Commander, MD, FACG  YOU HAD AN ENDOSCOPIC PROCEDURE TODAY AT THE [facility]:   Refer to the procedure report that was given to you for any specific questions about what was found during the examination.  If the procedure report does not answer your questions, please call your gastroenterologist to clarify.  If you requested that your care partner not be given the details of your procedure findings, then the procedure report has been included in a sealed envelope for you to review at your convenience later.  YOU SHOULD EXPECT: Some feelings of bloating in the abdomen. Passage of more gas than usual.  Walking can help get rid of the air that was put into your GI tract during the procedure and reduce the bloating. If you had a lower endoscopy (such as a colonoscopy or flexible sigmoidoscopy) you may notice spotting of blood in your stool or on the toilet paper. If you underwent a bowel prep for your procedure, you may not have a normal bowel movement for a few days.  Please Note:  You might notice some irritation and congestion in your nose or some drainage.  This is from the oxygen used during your procedure.  There is no need for concern and it should clear up in a day or so.  SYMPTOMS TO REPORT IMMEDIATELY:  Following lower endoscopy (colonoscopy or flexible sigmoidoscopy):  Excessive amounts of blood in the stool  Significant tenderness or worsening of abdominal pains  Swelling of the abdomen that is new, acute  Fever of 100F or higher  Resume previous diet Continue present medications   For urgent or emergent issues, a gastroenterologist can be reached at any hour by calling [phone]  [phone]. Do not use MyChart messaging for urgent concerns.    DIET:  We do recommend a small meal at first, but then you may proceed to your regular diet.  Drink plenty of fluids but you should avoid alcoholic beverages for 24 hours.  ACTIVITY:  You should plan to take it easy for the rest of today and you should NOT DRIVE or use heavy machinery until tomorrow (because of the sedation medicines used during the test).    FOLLOW UP: Our staff will call the number listed on your records the next business day following your procedure.  We will call around 7:15- 8:00 am to check on you and address any questions or concerns that you may have regarding the information given to you following your procedure. If we do not reach you, we will leave a message.     If any biopsies were taken you will be contacted by phone or by letter within the next 1-3 weeks.  Please call us  at [phone] if you have not heard about the biopsies in 3 weeks.    SIGNATURES/CONFIDENTIALITY: You and/or your care partner have signed paperwork which will be entered into your electronic medical record.  These signatures attest to the fact that that the information above on your After Visit Summary has been reviewed and is understood.  Full responsibility of the confidentiality of this discharge information lies with you and/or your care-partner.

## 2023-10-13 NOTE — Op Note (Signed)
 [facility] Patient Name: Douglas Nunez Procedure Date: 10/13/2023 8:35 AM MRN: [id number] Endoscopist: Lupita FORBES Commander , MD, [id number] Age: 68 Referring MD:  Date of Birth: 1955/06/17 Gender: Male Account #: [id number] Procedure:                Colonoscopy Indications:              Surveillance: Personal history of adenomatous                            polyps on last colonoscopy 5 years ago, Last                            colonoscopy: July 2020 Medicines:                Monitored Anesthesia Care Procedure:                Pre-Anesthesia Assessment:                           - Prior to the procedure, a History and Physical                            was performed, and patient medications and                            allergies were reviewed. The patient's tolerance of                            previous anesthesia was also reviewed. The risks                            and benefits of the procedure and the sedation                            options and risks were discussed with the patient.                            All questions were answered, and informed consent                            was obtained. Prior Anticoagulants: The patient has                            taken no anticoagulant or antiplatelet agents. ASA                            Grade Assessment: III - A patient with severe                            systemic disease. After reviewing the risks and                            benefits, the patient was deemed in satisfactory  condition to undergo the procedure.                           After obtaining informed consent, the colonoscope                            was passed under direct vision. Throughout the                            procedure, the patient's blood pressure, pulse, and                            oxygen saturations were monitored continuously. The                            Olympus Scope SN [id number] was introduced  through the                            anus and advanced to the the cecum, identified by                            appendiceal orifice and ileocecal valve. The                            colonoscopy was performed without difficulty. The                            patient tolerated the procedure well. The quality                            of the bowel preparation was good. The ileocecal                            valve, appendiceal orifice, and rectum were                            photographed. The bowel preparation used was SUPREP                            via split dose instruction. Scope In: 8:52:51 AM Scope Out: 9:09:06 AM Scope Withdrawal Time: 0 hours 13 minutes 26 seconds  Total Procedure Duration: 0 hours 16 minutes 15 seconds  Findings:                 Hemorrhoids were found on perianal exam.                           The digital rectal exam was normal. Pertinent                            negatives include normal prostate (size, shape, and                            consistency).  Multiple large-mouthed, medium-mouthed and                            small-mouthed diverticula were found in the sigmoid                            colon, descending colon, transverse colon and                            ascending colon.                           The exam was otherwise without abnormality on                            direct and retroflexion views. Complications:            No immediate complications. Estimated Blood Loss:     Estimated blood loss: none. Impression:               - Hemorrhoids found on perianal exam.                           - Diverticulosis in the sigmoid colon, in the                            descending colon, in the transverse colon and in                            the ascending colon.                           - The examination was otherwise normal on direct                            and retroflexion views.                            - No specimens collected.                           - Personal history of colonic polyps. 3 diminutive                            adenomas 09/2018 Recommendation:           - Patient has a contact number available for                            emergencies. The signs and symptoms of potential                            delayed complications were discussed with the                            patient. Return to normal activities tomorrow.  Written discharge instructions were provided to the                            patient.                           - Resume previous diet.                           - Continue present medications.                           - Repeat colonoscopy in 7 years for surveillance. Lupita FORBES Commander, MD 10/13/2023 9:15:25 AM This report has been signed electronically.

## 2023-10-13 NOTE — Progress Notes (Signed)
 Sedate, gd SR, tolerated procedure well, VSS, report to RN

## 2023-10-14 ENCOUNTER — Telehealth: Payer: Self-pay | Admitting: *Deleted

## 2023-10-14 ENCOUNTER — Ambulatory Visit

## 2023-10-14 ENCOUNTER — Other Ambulatory Visit: Payer: Self-pay | Admitting: Physician Assistant

## 2023-10-14 DIAGNOSIS — B351 Tinea unguium: Secondary | ICD-10-CM

## 2023-10-14 NOTE — Telephone Encounter (Signed)
  Follow up Call-     10/13/2023    7:45 AM  Call back number  Post procedure Call Back phone  # [phone]  Permission to leave phone message Yes     Patient questions:  Do you have a fever, pain , or abdominal swelling? No. Pain Score  0 *  Have you tolerated food without any problems? Yes.    Have you been able to return to your normal activities? Yes.    Do you have any questions about your discharge instructions: Diet   No. Medications  No. Follow up visit  No.  Do you have questions or concerns about your Care? No.  Actions: * If pain score is 4 or above: No action needed, pain <4.

## 2023-10-20 ENCOUNTER — Ambulatory Visit (INDEPENDENT_AMBULATORY_CARE_PROVIDER_SITE_OTHER)

## 2023-10-20 DIAGNOSIS — Z91038 Other insect allergy status: Secondary | ICD-10-CM | POA: Diagnosis not present

## 2023-10-27 ENCOUNTER — Ambulatory Visit (INDEPENDENT_AMBULATORY_CARE_PROVIDER_SITE_OTHER)

## 2023-10-27 DIAGNOSIS — Z91038 Other insect allergy status: Secondary | ICD-10-CM | POA: Diagnosis not present

## 2023-11-03 ENCOUNTER — Ambulatory Visit (INDEPENDENT_AMBULATORY_CARE_PROVIDER_SITE_OTHER)

## 2023-11-03 DIAGNOSIS — Z91038 Other insect allergy status: Secondary | ICD-10-CM

## 2023-11-11 ENCOUNTER — Ambulatory Visit (INDEPENDENT_AMBULATORY_CARE_PROVIDER_SITE_OTHER)

## 2023-11-11 DIAGNOSIS — Z91038 Other insect allergy status: Secondary | ICD-10-CM | POA: Diagnosis not present

## 2023-11-15 ENCOUNTER — Other Ambulatory Visit: Payer: Self-pay | Admitting: Allergy and Immunology

## 2023-11-17 ENCOUNTER — Ambulatory Visit (INDEPENDENT_AMBULATORY_CARE_PROVIDER_SITE_OTHER)

## 2023-11-17 DIAGNOSIS — T782XXD Anaphylactic shock, unspecified, subsequent encounter: Secondary | ICD-10-CM | POA: Diagnosis not present

## 2023-11-17 DIAGNOSIS — T63481D Toxic effect of venom of other arthropod, accidental (unintentional), subsequent encounter: Secondary | ICD-10-CM

## 2023-11-17 DIAGNOSIS — T63481S Toxic effect of venom of other arthropod, accidental (unintentional), sequela: Secondary | ICD-10-CM

## 2023-11-24 ENCOUNTER — Ambulatory Visit (INDEPENDENT_AMBULATORY_CARE_PROVIDER_SITE_OTHER)

## 2023-11-24 DIAGNOSIS — T63481S Toxic effect of venom of other arthropod, accidental (unintentional), sequela: Secondary | ICD-10-CM

## 2023-12-01 ENCOUNTER — Ambulatory Visit (INDEPENDENT_AMBULATORY_CARE_PROVIDER_SITE_OTHER)

## 2023-12-01 DIAGNOSIS — Z91038 Other insect allergy status: Secondary | ICD-10-CM

## 2023-12-08 ENCOUNTER — Ambulatory Visit: Attending: Cardiovascular Disease | Admitting: Cardiovascular Disease

## 2023-12-08 ENCOUNTER — Encounter: Payer: Self-pay | Admitting: Cardiovascular Disease

## 2023-12-08 ENCOUNTER — Ambulatory Visit (INDEPENDENT_AMBULATORY_CARE_PROVIDER_SITE_OTHER)

## 2023-12-08 VITALS — BP 136/70 | HR 68 | Ht 69.5 in | Wt 192.0 lb

## 2023-12-08 DIAGNOSIS — I152 Hypertension secondary to endocrine disorders: Secondary | ICD-10-CM | POA: Insufficient documentation

## 2023-12-08 DIAGNOSIS — Z91038 Other insect allergy status: Secondary | ICD-10-CM | POA: Diagnosis not present

## 2023-12-08 DIAGNOSIS — I2510 Atherosclerotic heart disease of native coronary artery without angina pectoris: Secondary | ICD-10-CM | POA: Diagnosis present

## 2023-12-08 DIAGNOSIS — E1159 Type 2 diabetes mellitus with other circulatory complications: Secondary | ICD-10-CM | POA: Diagnosis present

## 2023-12-08 DIAGNOSIS — E782 Mixed hyperlipidemia: Secondary | ICD-10-CM | POA: Insufficient documentation

## 2023-12-08 MED ORDER — AMLODIPINE BESYLATE 5 MG PO TABS: 5.0000 mg | ORAL_TABLET | Freq: Every morning | ORAL | 4 refills | Status: AC

## 2023-12-08 NOTE — Assessment & Plan Note (Signed)
 History of hyperlipidemia intolerant to statin therapy on Repatha  with lipid profile performed 09/15/2023 revealing total cholesterol 100, LDL 48 and HDL 38.

## 2023-12-08 NOTE — Patient Instructions (Signed)

## 2023-12-08 NOTE — Progress Notes (Signed)
 12/08/2023 Douglas Nunez.   1956/02/19  [id number]  Primary Physician Cyndi Shaver, PA-C (Inactive) Primary Cardiologist: Dorn JINNY Lesches MD [name], [place]  HPI:  Douglas Nunez. is a 68 y.o.   fit appearing married Caucasian male father of 2, grandfather 2 grandchildren whose father, Douglas Nunez, [name], was my patient .Douglas Nunez  His wife Douglas Nunez was a Producer, television/film/video and accompanies him today.  Burgess was referred to me by Dr. Knowlton for cardiovascular evaluation because of risk factors.  I last saw him in the office 12/10/2019. His cardiovascular risk factor profile is notable for treated hypertension and diabetes.  His LDL is mildly elevated.  He is never smoked.  Does have a strong family history for heart disease with both mother and father both of who had myocardial infarction's.  He is never had a heart attack or stroke.  He gets occasional atypical chest pain which does not sound ischemic and has some dyspnea on exertion probably from deconditioning.  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  He is currently enjoying his retirement.  He and his wife bought a cabin [place] in the mountains of [place] .   Since I saw him in the office 4 years ago he has done well.  He apparently has mast cell disease and is followed by hematology here and at [facility].  He has had anaphylaxis to bee stings.  He had a coronary calcium  score of 130 performed 10/27/2018 with a negative coronary CTA and normal 2D echo.  He is very active and asymptomatic.   Current Meds  Medication Sig   aspirin  81 MG tablet Take 81 mg by mouth daily.   BD PEN NEEDLE NANO U/F 32G X 4 MM MISC USE ONCE DAILY TO ADMINSTER INSULIN  ONCE A DAY for 90 days   Cholecalciferol (VITAMIN D ) 50 MCG (2000 UT) tablet    Cyanocobalamin (VITAMIN B 12 PO)    empagliflozin  (JARDIANCE ) 25 MG TABS tablet Take 25 mg by mouth daily.    EPINEPHrine  0.3 mg/0.3 mL IJ SOAJ injection Inject 0.3 mg into the muscle  as needed.   Evolocumab  (REPATHA  SURECLICK) 140 MG/ML SOAJ Inject 140 mg into the skin every 14 (fourteen) days.   famotidine  (PEPCID ) 20 MG tablet TAKE 1 TABLET BY MOUTH TWICE A DAY   Insulin  Glargine (BASAGLAR  KWIKPEN) 100 UNIT/ML Inject 40 Units into the skin daily.   loratadine  (CLARITIN ) 10 MG tablet Take 1 tablet (10 mg total) by mouth 2 (two) times daily.   losartan  (COZAAR ) 50 MG tablet Take 50 mg by mouth daily.   ONETOUCH VERIO test strip 3 (three) times daily.   [DISCONTINUED] amLODipine  (NORVASC ) 5 MG tablet Take 5 mg every morning by mouth.      Allergies  Allergen Reactions   Bee Venom Anaphylaxis   Trulicity [Dulaglutide] Diarrhea and Nausea Only    Belching, stomach cramps, diarrhea   Metformin Hcl Other (See Comments)    Flushed   Nsaids Itching and Rash   Victoza [Liraglutide] Nausea Only    Social History   Socioeconomic History   Marital status: Married    Spouse name: Not on file   Number of children: 2   Years of education: Not on file   Highest education level: Associate degree: academic program  Occupational History   Occupation: Teaching laboratory technician: [employer]    Comment: Holiday representative  Tobacco Use  Smoking status: Never    Passive exposure: Never   Smokeless tobacco: Never  Vaping Use   Vaping status: Never Used  Substance and Sexual Activity   Alcohol use: Yes    Alcohol/week: 0.0 standard drinks of alcohol    Comment: occ   Drug use: No   Sexual activity: Not on file  Other Topics Concern   Not on file  Social History Narrative   Married to Douglas Nunez   Never smoker   Rare EtOH   No drugs   Social Drivers of Corporate investment banker Strain: Low Risk  (07/15/2023)   Overall Financial Resource Strain (CARDIA)    Difficulty of Paying Living Expenses: Not hard at all  Food Insecurity: No Food Insecurity (07/15/2023)   Hunger Vital Sign    Worried About Running Out of Food in the Last Year: Never true    Ran Out  of Food in the Last Year: Never true  Transportation Needs: No Transportation Needs (07/15/2023)   PRAPARE - Administrator, Civil Service (Medical): No    Lack of Transportation (Non-Medical): No  Physical Activity: Insufficiently Active (07/15/2023)   Exercise Vital Sign    Days of Exercise per Week: 2 days    Minutes of Exercise per Session: 60 min  Stress: No Stress Concern Present (07/15/2023)   [employer] of Occupational Health - Occupational Stress Questionnaire    Feeling of Stress : Not at all  Social Connections: Unknown (07/15/2023)   Social Connection and Isolation Panel    Frequency of Communication with Friends and Family: More than three times a week    Frequency of Social Gatherings with Friends and Family: More than three times a week    Attends Religious Services: Patient declined    Database administrator or Organizations: Patient declined    Attends Engineer, structural: More than 4 times per year    Marital Status: Married  Catering manager Violence: Not At Risk (07/15/2023)   Humiliation, Afraid, Rape, and Kick questionnaire    Fear of Current or Ex-Partner: No    Emotionally Abused: No    Physically Abused: No    Sexually Abused: No     Review of Systems: General: negative for chills, fever, night sweats or weight changes.  Cardiovascular: negative for chest pain, dyspnea on exertion, edema, orthopnea, palpitations, paroxysmal nocturnal dyspnea or shortness of breath Dermatological: negative for rash Respiratory: negative for cough or wheezing Urologic: negative for hematuria Abdominal: negative for nausea, vomiting, diarrhea, bright red blood per rectum, melena, or hematemesis Neurologic: negative for visual changes, syncope, or dizziness All other systems reviewed and are otherwise negative except as noted above.    Blood pressure 136/70, pulse 68, height 5' 9.5 (1.765 m), weight 192 lb (87.1 kg), SpO2 98%.  General appearance: alert  and no distress Neck: no adenopathy, no carotid bruit, no JVD, supple, symmetrical, trachea midline, and thyroid  not enlarged, symmetric, no tenderness/mass/nodules Lungs: clear to auscultation bilaterally Heart: regular rate and rhythm, S1, S2 normal, no murmur, click, rub or gallop Extremities: extremities normal, atraumatic, no cyanosis or edema Pulses: 2+ and symmetric Skin: Skin color, texture, turgor normal. No rashes or lesions Neurologic: Grossly normal  EKG EKG Interpretation Date/Time:  Monday December 08 2023 13:17:18 EDT Ventricular Rate:  68 PR Interval:  166 QRS Duration:  78 QT Interval:  382 QTC Calculation: 406 R Axis:   -16  Text Interpretation: Normal sinus rhythm Normal ECG When compared with  ECG of 02-Nov-2022 14:13, PREVIOUS ECG IS PRESENT Confirmed by Court Carrier [phone]) on 12/08/2023 1:21:16 PM    ASSESSMENT AND PLAN:   Hypertension associated with diabetes (HCC) History of essential hypertension blood pressure measured today at 136/70.  He is on amlodipine  and losartan .  Hyperlipidemia History of hyperlipidemia intolerant to statin therapy on Repatha  with lipid profile performed 09/15/2023 revealing total cholesterol 100, LDL 48 and HDL 38.  Coronary artery disease History of CAD with a coronary calcium  score 130 performed 10/27/2018 with subsequent CTA that was negative by FFR analysis except for ostial D1.  He is completely asymptomatic and at goal for secondary prevention.     Carrier DOROTHA Court MD FACP,FACC,FAHA, [facility] 12/08/2023 1:36 PM

## 2023-12-08 NOTE — Assessment & Plan Note (Signed)
 History of essential hypertension blood pressure measured today at 136/70.  He is on amlodipine  and losartan .

## 2023-12-08 NOTE — Assessment & Plan Note (Signed)
 History of CAD with a coronary calcium  score 130 performed 10/27/2018 with subsequent CTA that was negative by FFR analysis except for ostial D1.  He is completely asymptomatic and at goal for secondary prevention.

## 2023-12-15 ENCOUNTER — Ambulatory Visit

## 2023-12-15 DIAGNOSIS — Z91038 Other insect allergy status: Secondary | ICD-10-CM

## 2023-12-19 ENCOUNTER — Ambulatory Visit: Admitting: Family Medicine

## 2023-12-22 ENCOUNTER — Ambulatory Visit

## 2023-12-22 DIAGNOSIS — Z91038 Other insect allergy status: Secondary | ICD-10-CM

## 2023-12-23 ENCOUNTER — Ambulatory Visit: Admitting: Allergy and Immunology

## 2023-12-23 VITALS — BP 126/66 | HR 77 | Temp 98.2°F | Resp 14 | Ht 67.52 in | Wt 188.0 lb

## 2023-12-23 DIAGNOSIS — D4702 Systemic mastocytosis: Secondary | ICD-10-CM

## 2023-12-23 DIAGNOSIS — T63481S Toxic effect of venom of other arthropod, accidental (unintentional), sequela: Secondary | ICD-10-CM | POA: Diagnosis not present

## 2023-12-23 DIAGNOSIS — T782XXS Anaphylactic shock, unspecified, sequela: Secondary | ICD-10-CM | POA: Diagnosis not present

## 2023-12-23 MED ORDER — FAMOTIDINE 20 MG PO TABS: 20.0000 mg | ORAL_TABLET | Freq: Two times a day (BID) | ORAL | 3 refills | Status: AC

## 2023-12-23 MED ORDER — EPINEPHRINE 0.3 MG/0.3ML IJ SOAJ: 0.3000 mg | INTRAMUSCULAR | 1 refills | Status: DC | PRN

## 2023-12-23 MED ORDER — LORATADINE 10 MG PO TABS: 10.0000 mg | ORAL_TABLET | Freq: Two times a day (BID) | ORAL | 3 refills | Status: AC

## 2023-12-23 NOTE — Progress Notes (Unsigned)
 [name]

## 2023-12-23 NOTE — Progress Notes (Unsigned)
 [place] - [place] - [place] - [place] - [place]   Follow-up Note  Referring Provider: Cyndi Shaver, PA-C Primary Provider: Cyndi Shaver, PA-C (Inactive) Date of Office Visit: 12/23/2023  Subjective:   Douglas Nunez. (DOB: Oct 16, 1955) is a 68 y.o. male who [facility] in re-evaluation of the following:  HPI: Douglas Nunez returns to this clinic in evaluation of mastocytosis and anaphylaxis to hymenoptera venom exposure.  I last saw him in this clinic 17 June 2023.  He is doing quite well with his venom immunotherapy and is moving to every 2 weeks with his next injection.  Thus, he is receiving full dose injection and other than developing some occasional large local reactions at injection site he has not had any problems with these injections.  His mastocytosis, confirmed by bone marrow, without a c-kit mutation, appears to be stable without any allergic reactions while consistently using an H1 and H2 receptor blocker twice a day.  Allergies as of 12/23/2023       Reactions   Bee Venom Anaphylaxis   Trulicity [dulaglutide] Diarrhea, Nausea Only   Belching, stomach cramps, diarrhea   Metformin Hcl Other (See Comments)   Flushed   Nsaids Itching, Rash   Victoza [liraglutide] Nausea Only        Medication List    amLODipine  5 MG tablet Commonly known as: NORVASC  Take 1 tablet (5 mg total) by mouth every morning.   aspirin  81 MG tablet Take 81 mg by mouth daily.   Basaglar  KwikPen 100 UNIT/ML Inject 40 Units into the skin daily.   BD Pen Needle Nano U/F 32G X 4 MM Misc Generic drug: Insulin  Pen Needle USE ONCE DAILY TO ADMINSTER INSULIN  ONCE A DAY for 90 days   empagliflozin  25 MG Tabs tablet Commonly known as: JARDIANCE  Take 25 mg by mouth daily.   EPINEPHrine  0.3 mg/0.3 mL Soaj injection Commonly known as: EPI-PEN Inject 0.3 mg into the muscle as needed.   famotidine  20 MG tablet Commonly known as:  PEPCID  TAKE 1 TABLET BY MOUTH TWICE A DAY   loratadine  10 MG tablet Commonly known as: CLARITIN  Take 1 tablet (10 mg total) by mouth 2 (two) times daily.   losartan  50 MG tablet Commonly known as: COZAAR  Take 50 mg by mouth daily.   OneTouch Verio test strip Generic drug: glucose blood 3 (three) times daily.   Repatha  SureClick 140 MG/ML Soaj Generic drug: Evolocumab  Inject 140 mg into the skin every 14 (fourteen) days.   VITAMIN B 12 PO   Vitamin D  50 MCG (2000 UT) tablet    Past Medical History:  Diagnosis Date   Allergy     Anaphylaxis due to hymenoptera venom    Cataract 2016, 2017   bilateral extractions with lens implant bilaterally   Compression fracture of thoracic vertebra (HCC) T11    seen on CT   Degenerative joint disease (DJD) of lumbar spine    Diabetes mellitus without complication (HCC)    GERD (gastroesophageal reflux disease)    Hx of adenomatous colonic polyps 10/07/2018   Hyperlipidemia    Hypertension    Mast cell activation syndrome    Melanoma (HCC)    Skin cancer    several places removed, and one Moths surgery on right temple   Vasovagal syncope 2018   associated with diarrhea, hyperglycemia    Past Surgical History:  Procedure Laterality Date   APPENDECTOMY     age 62   CATARACT  EXTRACTION Bilateral    and lens implanted   COLONOSCOPY     KNEE SURGERY Left 1985   LASIK Bilateral    MOHS SURGERY Right    temple   RETINAL DETACHMENT SURGERY Right    SKIN BIOPSY     multiple skin cancers removed    Review of systems negative except as noted in HPI / PMHx or noted below:  Review of Systems  Constitutional: Negative.   HENT: Negative.    Eyes: Negative.   Respiratory: Negative.    Cardiovascular: Negative.   Gastrointestinal: Negative.   Genitourinary: Negative.   Musculoskeletal: Negative.   Skin: Negative.   Neurological: Negative.   Endo/Heme/Allergies: Negative.   Psychiatric/Behavioral: Negative.       Objective:    Vitals:   12/23/23 0830  BP: 126/66  Pulse: 77  Resp: 14  Temp: 98.2 F (36.8 C)  SpO2: 98%   Height: 5' 7.52 (171.5 cm)  Weight: 188 lb (85.3 kg)   Physical Exam Constitutional:      Appearance: He is not diaphoretic.  HENT:     Head: Normocephalic.     Right Ear: Tympanic membrane, ear canal and external ear normal.     Left Ear: Tympanic membrane, ear canal and external ear normal.     Nose: Nose normal. No mucosal edema or rhinorrhea.     Mouth/Throat:     Pharynx: Uvula midline. No oropharyngeal exudate.  Eyes:     Conjunctiva/sclera: Conjunctivae normal.  Neck:     Thyroid : No thyromegaly.     Trachea: Trachea normal. No tracheal tenderness or tracheal deviation.  Cardiovascular:     Rate and Rhythm: Normal rate and regular rhythm.     Heart sounds: Normal heart sounds, S1 normal and S2 normal. No murmur heard. Pulmonary:     Effort: No respiratory distress.     Breath sounds: Normal breath sounds. No stridor. No wheezing or rales.  Lymphadenopathy:     Head:     Right side of head: No tonsillar adenopathy.     Left side of head: No tonsillar adenopathy.     Cervical: No cervical adenopathy.  Skin:    Findings: No erythema or rash.     Nails: There is no clubbing.  Neurological:     Mental Status: He is alert.     Diagnostics: none  Assessment and Plan:   1. 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 Indolent systemic mastocytosis    1. Continue Claritin  10 mg + Famotidine  20 mg - 2 times per day  2. Continue venom immunotherapy  3. Epi-Pen, benadryl , MD/ER evaluation for allergic reaction  4. Plan for fall flu vaccine  5. Influenza = Tamiflu. Covid = Paxlovid.  6. Return to clinic in 12 months or earlier if problem  Douglas Nunez is doing very well on his current plan which includes an H1 and H2 receptor blocker utilized as a preventative for recurrent episodes of anaphylaxis and his immunotherapy directed  against venom is going quite well without any significant side effects.  He is going to remain on the plan noted above and we will consider further evaluation for his mastocytosis pending his natural history over the course of the next year.  If we get into problems with his mastocytosis we can always consider giving him a trial avapritinib.  Camellia Denis, MD Allergy  / Immunology [facility]

## 2023-12-23 NOTE — Patient Instructions (Addendum)
  1. Continue Claritin  10 mg + Famotidine  20 mg - 2 times per day  2. Continue venom immunotherapy  3. Epi-Pen, benadryl , MD/ER evaluation for allergic reaction  4. Plan for fall flu vaccine  5. Influenza = Tamiflu. Covid = Paxlovid.  6. Return to clinic in 12 months or earlier if problem

## 2023-12-24 ENCOUNTER — Encounter: Payer: Self-pay | Admitting: Allergy and Immunology

## 2024-01-05 ENCOUNTER — Ambulatory Visit (INDEPENDENT_AMBULATORY_CARE_PROVIDER_SITE_OTHER)

## 2024-01-05 DIAGNOSIS — Z91038 Other insect allergy status: Secondary | ICD-10-CM

## 2024-01-19 ENCOUNTER — Other Ambulatory Visit: Payer: Self-pay

## 2024-01-19 DIAGNOSIS — D4709 Other mast cell neoplasms of uncertain behavior: Secondary | ICD-10-CM

## 2024-01-20 ENCOUNTER — Inpatient Hospital Stay: Admitting: Hematology

## 2024-01-20 ENCOUNTER — Encounter: Payer: Self-pay | Admitting: Hematology

## 2024-01-20 ENCOUNTER — Inpatient Hospital Stay: Attending: Hematology

## 2024-01-20 VITALS — BP 143/69 | HR 59 | Temp 97.7°F | Resp 20 | Wt 190.5 lb

## 2024-01-20 DIAGNOSIS — M810 Age-related osteoporosis without current pathological fracture: Secondary | ICD-10-CM | POA: Diagnosis not present

## 2024-01-20 DIAGNOSIS — D4709 Other mast cell neoplasms of uncertain behavior: Secondary | ICD-10-CM

## 2024-01-20 DIAGNOSIS — E119 Type 2 diabetes mellitus without complications: Secondary | ICD-10-CM | POA: Insufficient documentation

## 2024-01-20 DIAGNOSIS — D4702 Systemic mastocytosis: Secondary | ICD-10-CM

## 2024-01-20 LAB — CBC WITH DIFFERENTIAL (CANCER CENTER ONLY)
Abs Immature Granulocytes: 0.02 K/uL (ref 0.00–0.07)
Basophils Absolute: 0.1 K/uL (ref 0.0–0.1)
Basophils Relative: 2 %
Eosinophils Absolute: 0.2 K/uL (ref 0.0–0.5)
Eosinophils Relative: 4 %
HCT: 46.8 % (ref 39.0–52.0)
Hemoglobin: 15.8 g/dL (ref 13.0–17.0)
Immature Granulocytes: 0 %
Lymphocytes Relative: 30 %
Lymphs Abs: 1.6 K/uL (ref 0.7–4.0)
MCH: 30.7 pg (ref 26.0–34.0)
MCHC: 33.8 g/dL (ref 30.0–36.0)
MCV: 91.1 fL (ref 80.0–100.0)
Monocytes Absolute: 0.4 K/uL (ref 0.1–1.0)
Monocytes Relative: 7 %
Neutro Abs: 2.9 K/uL (ref 1.7–7.7)
Neutrophils Relative %: 57 %
Platelet Count: 189 K/uL (ref 150–400)
RBC: 5.14 MIL/uL (ref 4.22–5.81)
RDW: 12.3 % (ref 11.5–15.5)
WBC Count: 5.1 K/uL (ref 4.0–10.5)
nRBC: 0 % (ref 0.0–0.2)

## 2024-01-20 LAB — CMP (CANCER CENTER ONLY)
ALT: 22 U/L (ref 0–44)
AST: 23 U/L (ref 15–41)
Albumin: 4.4 g/dL (ref 3.5–5.0)
Alkaline Phosphatase: 67 U/L (ref 38–126)
Anion gap: 7 (ref 5–15)
BUN: 20 mg/dL (ref 8–23)
CO2: 27 mmol/L (ref 22–32)
Calcium: 9.8 mg/dL (ref 8.9–10.3)
Chloride: 107 mmol/L (ref 98–111)
Creatinine: 0.86 mg/dL (ref 0.61–1.24)
GFR, Estimated: >60 mL/min (ref >60)
Glucose, Bld: 125 mg/dL — ABNORMAL HIGH (ref 70–99)
Potassium: 4.3 mmol/L (ref 3.5–5.1)
Sodium: 141 mmol/L (ref 135–145)
Total Bilirubin: 0.7 mg/dL (ref 0.0–1.2)
Total Protein: 7.6 g/dL (ref 6.5–8.1)

## 2024-01-20 LAB — LACTATE DEHYDROGENASE: LDH: 136 U/L (ref 105–235)

## 2024-01-20 NOTE — Progress Notes (Signed)
 HEMATOLOGY ONCOLOGY PROGRESS NOTE  Date of service: 01/20/2024  Patient Care Team: Cyndi Shaver, PA-C (Inactive) as PCP - General (Physician Assistant) Faythe Purchase, MD as Consulting Physician (Endocrinology)  CHIEF COMPLAINT/PURPOSE OF CONSULTATION: Follow-up for continued evaluation and management of systemic mastocytosis.  HISTORY OF PRESENTING ILLNESS:  Douglas Nunez. is a wonderful 68 y.o. male who has been referred to us  Dr. Kozlow for further evaluation of possible Systemic mastocytosis.    He was last seen by Dr. Maurilio on 06/17/2023.    Patient presents with his wife during today's visit.    Patient noted to have anaphylaxis reaction to bee /wasp and yellow jacket venom. Patient reports that he has been stung by Yellow jackets/wasps/honeybees about 300 times. Patient reports that he is typically mostly stung by wasps and yellow jackets. He reports that his first reaction occurred 10-12 years ago due to wasp sting. Patient did not have any reaction issues prior to that time.    He reports that around 10-12 years ago, patient also began having itchiness in his arms and scalp as well as redness after taking two tablets of Aleve. He notes that he took Aleve due to back pain after cutting wood and showering.    Patient reports that he cut wood again 1 week later, and took 2 tablets of Aleve at that time to manage back pain. He endorsed the same symptoms of itchy arms and scalp with redness. Patient was then recommended by his general practitioner to stop taking anti-inflammatory medication. Patient does not take anti-inflamatory medication at this time.    He reports having another episode which he attributed to cooking with certain spices.    Patient reports an incident of being stung by a wasp in the temple, causing itchiness in his arms/scalp with redness. He denies any swelling with his symptoms.   His wife reports that she has needed to perform CPR on patient in the past  and he has needed to present to the ED several times.    Patient denies working in a profession with frequent bee contact, though he does note living in an area close to nature.    Patient reports that he was previously on Trulicity for DM, which managed his blood sugars well. He reports that after about 3 months, at which time he would receive an injection once a week, he began endorsing explosive diarrhea two nights later and began going unconscious. Patient notes that he had the same symptoms the next week a couple days after taking Trulicity. Patient reports that he was thought to have a vasovagal reaction and received workup evaluating mast cells.    Patient is unsure if he has any baseline tryptase testing at a time without having a reaction.    His wife reports that patient did have an episode with flushing previously which was thought to be triggered by Metformin, which was then stopped.    Patient reports that he was last stung by a bee 4 weeks ago and took liquid benadryl .    At baseline, he is taking Claritin  10 MG twice daily, Famotidine  20 mg twice daily, and liquid benadryl  as needed. Patient denies taking Singulair or any Mast cell stabilizers.    Patient has been on antihistamine and Pepsid combination with Dr. Kozlow since about 2018.    Patient denies having any spontaneous large, full-body reactions outside of bee/wasp stings and anti-inflammatory medication, and Metformin.    Patient reports an incident in which he was working in  a closet near a drain pipe scraping black mold in 95 degree heat. He reports feeling an insect bite, possibly from mosquito or spider, though he did not see a welt on his arm. Patient reports feeling poorly once he finished his work, and eventually became unconscious before being able to inject himself with Epipen . He received CPR and was eventually injected with Epipen . Patient believes his syncope event was triggered either by insect bite or black  mold.    Patient reports that he did previously receive steroids at the hospital after having cardiac workup. He was not found to have any cardiac issues. His calcium  level were slightly elevated at that time.    He denies previously having any whole-body CT scan or bone marrow biopsy in the past.    Patient reports mild work-related arthritis. He denies any arthritis related to rheumatoid.    Patient reports having findings of increased reactivity overall to wasps, yellow jackets, and honey bees. I discussed that this could be a function of sensitization.    Patient denies any abdominal pain. Patient denies any hx of liver issues.    He reports lower back pain attributed to work.    Patient denies any unexplained fever, chills, night sweats, or sudden weight loss. Patient denies any fixed skin rashes.    Patient reports that he began desensitization injection therapy with Dr. Maurilio 3 weeks ago.    INTERVAL HISTORY: Douglas Nunez. is a 68 y.o. male who is here today for continued evaluation and management of systemic mastocytosis.   he was last seen by me on 09/01/2023.  Today, he is accompanied by his wife. He reports that he is feeling well.   Fhx: He reports that his younger brother had appendicitis a few weeks ago that ruptured. Goblet cell carcinoma of the appendix was discovered.  He reports he is unaware of colon cancer in his family.  Mr. Pelcher previous colonoscopy results were improved, no polyps discovered.   Mr. Franks mentioned some slight itching at injection site since last injection. Otherwise, he tolerated the injection well.   Mr. Till saw his immunologist recently. The immunologist mentioned starting singular or a steroid. Mr. Shavers will return in about a year.  His wife mentioned that his cardiologist Dr. Wadie recommended scaling back on work to lower his BP. This has since improved and Mr. Blankenbaker is now working about 20 hours less per week.  Mr. Kilner  works in a profession with frequent exposure to insects. He previously has an anaphylaxis reaction to a bee/wasp sting and reacted to the venom. Denies any new lumps/bumps.  Based on full body CT scan, Mr. George mastocytosis looks to be slow-moving. Mr. Plant results showed a positive indication for the Kit mutation.   [place]    REVIEW OF SYSTEMS:   10 Point review of systems of done and is negative except as noted above.  MEDICAL HISTORY Past Medical History:  Diagnosis Date   Allergy     Anaphylaxis due to hymenoptera venom    Cataract 2016, 2017   bilateral extractions with lens implant bilaterally   Compression fracture of thoracic vertebra (HCC) T11    seen on CT   Degenerative joint disease (DJD) of lumbar spine    Diabetes mellitus without complication (HCC)    GERD (gastroesophageal reflux disease)    Hx of adenomatous colonic polyps 10/07/2018   Hyperlipidemia    Hypertension    Mast cell  activation syndrome    Melanoma (HCC)    Skin cancer    several places removed, and one Moths surgery on right temple   Vasovagal syncope 2018   associated with diarrhea, hyperglycemia    SURGICAL HISTORY Past Surgical History:  Procedure Laterality Date   APPENDECTOMY     age 1   CATARACT EXTRACTION Bilateral    and lens implanted   COLONOSCOPY     KNEE SURGERY Left 1985   LASIK Bilateral    MOHS SURGERY Right    temple   RETINAL DETACHMENT SURGERY Right    SKIN BIOPSY     multiple skin cancers removed    SOCIAL HISTORY Social History   Tobacco Use   Smoking status: Never    Passive exposure: Never   Smokeless tobacco: Never  Vaping Use   Vaping status: Never Used  Substance Use Topics   Alcohol use: Yes    Alcohol/week: 4.0 standard drinks of alcohol    Types: 4 Cans of beer per week   Drug use: No    Social History   Social History Narrative   Married to [place]   Never smoker   Rare EtOH   No  drugs    SOCIAL DRIVERS OF HEALTH SDOH Screenings   Food Insecurity: No Food Insecurity (01/20/2024)  Housing: Unknown (01/20/2024)  Transportation Needs: No Transportation Needs (01/20/2024)  Utilities: Not At Risk (01/20/2024)  Alcohol Screen: Low Risk  (01/20/2024)  Depression (PHQ2-9): Low Risk  (01/20/2024)  Financial Resource Strain: Low Risk  (07/15/2023)  Physical Activity: Insufficiently Active (07/15/2023)  Social Connections: Unknown (07/15/2023)  Stress: No Stress Concern Present (07/15/2023)  Tobacco Use: Low Risk  (01/20/2024)  Health Literacy: Adequate Health Literacy (07/15/2023)     FAMILY HISTORY Family History  Problem Relation Age of Onset   Allergic rhinitis Mother    Lung cancer Mother        light smoker   Diabetes Father    Heart attack Father        multiple    Cancer - Colon Neg Hx    Esophageal cancer Neg Hx    Rectal cancer Neg Hx    Stomach cancer Neg Hx    Colon cancer Neg Hx      ALLERGIES: is allergic to bee venom, trulicity [dulaglutide], metformin hcl, nsaids, and victoza [liraglutide].  MEDICATIONS  Current Outpatient Medications  Medication Sig Dispense Refill   amLODipine  (NORVASC ) 5 MG tablet Take 1 tablet (5 mg total) by mouth every morning. 90 tablet 4   aspirin  81 MG tablet Take 81 mg by mouth daily.     BD PEN NEEDLE NANO U/F 32G X 4 MM MISC USE ONCE DAILY TO ADMINSTER INSULIN  ONCE A DAY for 90 days     Cholecalciferol (VITAMIN D ) 50 MCG (2000 UT) tablet      cyanocobalamin (VITAMIN B12) 1000 MCG tablet Take 1,000 mcg by mouth daily.     empagliflozin  (JARDIANCE ) 25 MG TABS tablet Take 25 mg by mouth daily.      EPINEPHrine  0.3 mg/0.3 mL IJ SOAJ injection Inject 0.3 mg into the muscle as needed. 2 each 1   Evolocumab  (REPATHA  SURECLICK) 140 MG/ML SOAJ Inject 140 mg into the skin every 14 (fourteen) days. 6 mL 3   famotidine  (PEPCID ) 20 MG tablet Take 1 tablet (20 mg total) by mouth 2 (two) times daily. 180 tablet 3   Insulin  Glargine  (BASAGLAR  KWIKPEN) 100 UNIT/ML Inject 40 Units into the skin daily.  loratadine  (CLARITIN ) 10 MG tablet Take 1 tablet (10 mg total) by mouth 2 (two) times daily. 180 tablet 3   losartan  (COZAAR ) 50 MG tablet Take 50 mg by mouth daily.     ONETOUCH VERIO test strip 3 (three) times daily.     No current facility-administered medications for this visit.    PHYSICAL EXAMINATION: ECOG PERFORMANCE STATUS: 1 - Symptomatic but completely ambulatory VITALS: Vitals:   01/20/24 1041  BP: (!) 143/69  Pulse: (!) 59  Resp: 20  Temp: 97.7 F (36.5 C)  SpO2: 98%   Filed Weights   01/20/24 1041  Weight: 190 lb 8 oz (86.4 kg)   Body mass index is 29.38 kg/m.  GENERAL: alert, in no acute distress and comfortable SKIN: no acute rashes, no significant lesions EYES: conjunctiva are pink and non-injected, sclera anicteric OROPHARYNX: MMM, no exudates, no oropharyngeal erythema or ulceration NECK: supple, no JVD LYMPH:  no palpable lymphadenopathy in the cervical, axillary or inguinal regions LUNGS: clear to auscultation b/l with normal respiratory effort HEART: regular rate & rhythm ABDOMEN:  normoactive bowel sounds , non tender, not distended, no hepatosplenomegaly Extremity: no pedal edema PSYCH: alert & oriented x 3 with fluent speech NEURO: no focal motor/sensory deficits  LABORATORY DATA:   I have reviewed the data as listed     Latest Ref Rng & Units 01/20/2024    9:53 AM 08/25/2023    7:46 AM 07/18/2023    1:17 PM  CBC EXTENDED  WBC 4.0 - 10.5 K/uL 5.1  5.5  4.9   RBC 4.22 - 5.81 MIL/uL 5.14  5.29  5.10   Hemoglobin 13.0 - 17.0 g/dL 84.1  83.6  84.4   HCT 39.0 - 52.0 % 46.8  49.8  46.0   Platelets 150 - 400 K/uL 189  208  215   NEUT# 1.7 - 7.7 K/uL 2.9  3.4  2.9   Lymph# 0.7 - 4.0 K/uL 1.6  1.4  1.5     {ELAddOncLabs (Optional):33736}     Latest Ref Rng & Units 01/20/2024    9:53 AM 07/18/2023    1:17 PM 05/30/2023    3:04 PM  CMP  Glucose 70 - 99 mg/dL 874  85  794    BUN 8 - 23 mg/dL 20  18  19    Creatinine 0.61 - 1.24 mg/dL 9.13  9.08  8.90   Sodium 135 - 145 mmol/L 141  143  142   Potassium 3.5 - 5.1 mmol/L 4.3  4.4  4.5   Chloride 98 - 111 mmol/L 107  108  105   CO2 22 - 32 mmol/L 27  29  21    Calcium  8.9 - 10.3 mg/dL 9.8  9.6  9.4   Total Protein 6.5 - 8.1 g/dL 7.6  7.7  7.1   Total Bilirubin 0.0 - 1.2 mg/dL 0.7  0.7  0.3   Alkaline Phos 38 - 126 U/L 67  68  83   AST 15 - 41 U/L 23  27  23    ALT 0 - 44 U/L 22  22  21     09/05/2023  09/03/2023  08/25/2023 Bone Marrow Aspirate:  Surgical Pathology **** THIS IS AN ADDENDUM REPORT **** CASE: [id number] PATIENT: LEMOND CINNAMON Bone Marrow Report **********Addendum **********  Reason for Addendum #1:  Immunohistochemistry results  Clinical History: Mastocytosis     DIAGNOSIS:  BONE MARROW, ASPIRATE, CLOT, CORE: - Normocellular bone marrow (50%) with trilineage hematopoiesis and increased mast cells.  See  comment.  COMMENT:  Morphologic evaluation and immunohistochemical analysis reveals a normocellular bone marrow with increased mast cells, some of which appear atypical spindle-shaped.  Occasional clustering is also noted. Immunohistochemical stains reveal the mass cells are positive for mass cell tryptase, and CD117.  CD30 appears to highlight rare cells.  The findings are suggestive of but not pathognomonic of systemic mastocytosis and the possibility of mast cell activation syndrome cannot be completely excluded.  Correlation with systemic symptoms, serum tryptase levels, pending c-kit mutational analysis, and cytogenetics is recommended for further assessment.   MICROSCOPIC DESCRIPTION:  PERIPHERAL BLOOD SMEAR: Platelets: Adequate, no clumps identified Erythroid: Adequate Leukocytes: Adequate, negative for blasts or dysplastic granulocytes  BONE MARROW ASPIRATE: Cellular Erythroid precursors: Erythroid precursors show full sequence of generally orderly  maturation Granulocytic precursors: Granulocytic precursors show a full sequence of generally orderly maturation with Megakaryocytes: Typical in number and morphology Lymphocytes/plasma cells: not increased Other: Rare spindle-shaped mast cells  TOUCH PREPARATIONS: Cellular, confirmatory of aspirate findings  CLOT AND BIOPSY: The bone marrow clot and biopsy reveal normocellular bone marrow with trilineage hematopoiesis and rare groups of spindle-shaped mast cells.  Blasts are not increased.  Occasional lymphoid aggregates consisting primarily of small mature lymphocytes are noted.  The findings are confirmatory of the aspirate and touch prep impression.  SPECIAL STAINS:  CD117: CD117 highlights immature mononuclear cells, including the spindle-shaped mast cells CD30: CD30 highlights rare cells Mast cell tryptase: Mast cell tryptase highlights mast cells  Additional studies: C-kit mutational analysis  CELL COUNT DATA:  Bone Marrow count performed on 500 cells shows: Blasts:   0%   Myeloid:  48% Promyelocytes: 2%   Erythroid:     45% Myelocytes:    18%  Lymphocytes:   5% Metamyelocytes:     1%   Plasma cells:  1% Bands:    9% Neutrophils:   12%  M:E ratio:     1.06 Eosinophils:   6% Basophils:     0% Monocytes:     1%  Lab Data: CBC performed on 08/25/2023 shows: WBC: 5.5 k/uL  Neutrophils:   48% Hgb: 16.3 g/dL Lymphocytes:   70% HCT: 49.8 %    Monocytes:     5% MCV: 94.1 fL   Eosinophils:   0% RDW: 12.9 %    Basophils:     0% PLT: 208 k/uL    GROSS DESCRIPTION:  Specimen A: Aspirate smear.  Specimen B: Received in B-plus is a 1 x 0.5 x 0.1 cm aggregate of dark red soft material, submitted in 1 block.  Specimen C: Received in B-plus are few cores and irregular pieces of tan-red firm tissue, 0.6 x 0.5 x 0.2 cm in aggregate.  Submitted in 1 block following decalcification with Immunocal.  SW 08/25/2023        RADIOGRAPHIC STUDIES: I have personally reviewed  the radiological images as listed and agreed with the findings in the report.  Imaging Results  CT BONE MARROW BIOPSY & ASPIRATION Result Date: 08/25/2023 INDICATION: Unilateral bone marrow aspiration and biopsy to evaluate for mastocytosis. EXAM: CT GUIDED BONE MARROW ASPIRATION AND CORE BIOPSY MEDICATIONS: None. ANESTHESIA/SEDATION: Moderate (conscious) sedation was employed during this procedure. A total of Versed  2 mg and Fentanyl  100 mcg was administered intravenously. Moderate Sedation Time: 12 minutes. The patient's level of consciousness and vital signs were monitored continuously by radiology nursing throughout the procedure under my direct supervision. FLUOROSCOPY TIME:  CT dose; 160 mGycm COMPLICATIONS: None immediate. Estimated blood loss: <5 mL PROCEDURE:  RADIATION DOSE REDUCTION: This exam was performed according to the departmental dose-optimization program which includes automated exposure control, adjustment of the mA and/or kV according to patient size and/or use of iterative reconstruction technique. Informed written consent was obtained from the patient after a thorough discussion of the procedural risks, benefits and alternatives. All questions were addressed. Maximal Sterile Barrier Technique was utilized including caps, mask, sterile gowns, sterile gloves, sterile drape, hand hygiene and skin antiseptic. A timeout was performed prior to the initiation of the procedure. The patient was positioned prone and non-contrast localization CT was performed of the pelvis to demonstrate the iliac marrow spaces. Under sterile conditions and local anesthesia, an 11 gauge coaxial bone biopsy needle was advanced into the RIGHT iliac marrow space. Needle position was confirmed with CT imaging. Initially, bone marrow aspiration was performed. Next, the 11 gauge outer cannula was utilized to obtain a 1 iliac bone marrow core biopsy. Needle was removed. Hemostasis was obtained with compression. The patient  tolerated the procedure well. Samples were prepared with the cytotechnologist. IMPRESSION: Successful CT-guided bone marrow aspiration and biopsy. Thom Hall, MD [facility] Radiology Electronically Signed   By: Thom Hall M.D.   On: 08/25/2023 10:38     CT CHEST ABDOMEN PELVIS WO CONTRAST (Accession [id number]) (Order [id number]) Imaging Date: 08/01/2023 Department: [facility]-CT IMAGING Released By: Jarold Tanda CROME Authorizing: Onesimo Emaline Brink, MD    Exam Status   Status  Final [99]    PACS Intelerad Image Link    Show images for CT CHEST ABDOMEN PELVIS WO CONTRAST Study Result   Narrative & Impression  CLINICAL DATA:  Mastocytosis, evaluate for lesion. * Tracking Code: BO *   EXAM: CT CHEST, ABDOMEN AND PELVIS WITHOUT CONTRAST   TECHNIQUE: Multidetector CT imaging of the chest, abdomen and pelvis was performed following the standard protocol without IV contrast.   RADIATION DOSE REDUCTION: This exam was performed according to the departmental dose-optimization program which includes automated exposure control, adjustment of the mA and/or kV according to patient size and/or use of iterative reconstruction technique.   COMPARISON:  Multiple priors including CT September 06, 2016.   FINDINGS: CT CHEST FINDINGS   Cardiovascular: Aortic atherosclerosis. Coronary artery calcifications. Normal size heart. No significant pericardial effusion/thickening.   Mediastinum/Nodes: No suspicious thyroid  nodule. No pathologically enlarged mediastinal, hilar or axillary lymph nodes. Small hiatal hernia.   Lungs/Pleura: Bilateral scattered pulmonary nodules measure up to 9 mm in the right middle lobe on image 75/4 are stable from CT October 25, 2018 compatible with a benign finding.   Solid 5 mm pulmonary nodule in the right lung apex on image 31/4 was outside of the field of view on examination from 2020.    Musculoskeletal: No aggressive lytic or blastic lesion of bone. Diffuse demineralization of bone.   CT ABDOMEN PELVIS FINDINGS   Hepatobiliary: Unremarkable noncontrast enhanced appearance of the hepatic parenchyma. Gallbladder is unremarkable. No biliary ductal dilation.   Pancreas: No pancreatic ductal dilation or evidence of acute inflammation.   Spleen: No splenomegaly.   Adrenals/Urinary Tract: 9 mm right adrenal nodule on image 47/2 is stable dating back to September 06, 2016 compatible with a benign adenoma in requiring no independent imaging follow-up. Nodularity of the genu of the left adrenal gland measuring 9 mm on image 51/2 is also stable dating back to 2018 compatible with a benign adenoma and requiring no independent imaging follow-up.   Masslike asymmetry in the left kidney  which distorts the renal contour measures 2.8 cm on image 62/2 it Hounsfield units of 29, this reflects a cortical outpouching on contrast enhanced CT September 06, 2016 compatible with a benign finding.   Bilateral renal lesions which measure fluid density are compatible with cysts and considered benign requiring no independent imaging follow-up. Additional tiny bilateral hypodensities are technically too small to accurately characterize but statistically likely reflect cysts and considered benign requiring no independent imaging follow-up.   Nonobstructive 1 mm right interpolar renal stone.   Urinary bladder is unremarkable for degree of distension.   Stomach/Bowel: Tiny hiatal hernia. Colonic diverticulosis. No evidence of acute bowel inflammation or obstruction.   Vascular/Lymphatic: Aortic atherosclerosis. Normal caliber abdominal aorta. Retroaortic left renal vein. No pathologically enlarged abdominal or pelvic lymph nodes.   Reproductive: Prostate is unremarkable.   Other: No significant abdominopelvic free fluid.   Musculoskeletal: Sclerotic lesion in the anterior L4 vertebral  body is favored a benign bone island. Diffuse demineralization of bone. Multilevel degenerative changes spine.   IMPRESSION: 1. Diffuse demineralization of the lumbar spine may reflect osteopenia or a marrow replacement process. 2. Sclerotic foci in the L4 vertebral body are favored benign bone islands. 3. No discrete suspicious mass or lymphadenopathy in the chest, abdomen or pelvis on this noncontrast enhanced examination. 4. Solid 5 mm pulmonary nodule in the right lung apex was outside of the field of view on examination from 2020. Consider follow-up chest CT in 6-12 months. 5. Nonobstructive 1 mm right interpolar renal stone. 6. Colonic diverticulosis without evidence of acute bowel inflammation. 7.  Aortic Atherosclerosis (ICD10-I70.0).     Electronically Signed   By: Reyes Holder M.D.   On: 08/04/2023 08:40      ASSESSMENT & PLAN:   68 y.o. male with:   R/o Systemic mastocytosis in the conext of recurrent anaphylactic reactions and elevated tryptase levels.  Anaphylaxis reaction to bee/wasp and Yellow Jacket venom  PLAN: -Discussed full body CT scan  -Mastocytosis looks to be slow moving - KIT mutation was not detected on molecular studies. -Educated patient on Kit mutation of undulant, slow growing mastocytosis  -Discussed symptom management and discussed immunotherapy  -Discussed option of taking an H1 and H2 antihistamine blocker in the case he was stung by bee/wasp -Discussed Omalizumab as a steroid to control symptoms and cyto-reduction to knock out mast cells -Discussed continuing colonoscopic screening -Recommended  increasing calcium  intake, yogurt or whole milk, 1000-15000mg  a day, while he is on immunotherapy -Follow-up in 3 months and with labs and visit with Dr. Onesimo in six months    FOLLOW-UP in 6 months for labs and follow-up with Dr. Onesimo.  The total time spent in the appointment was *** minutes* .  All of the patient's questions were answered  and the patient knows to call the clinic with any problems, questions, or concerns.  Emaline Onesimo MD MS AAHIVMS [facility] Hematology/Oncology Physician [facility]  *Total Encounter Time as defined by the Centers for Medicare and Medicaid Services includes, in addition to the face-to-face time of a patient visit (documented in the note above) non-face-to-face time: obtaining and reviewing outside history, ordering and reviewing medications, tests or procedures, care coordination (communications with other health care professionals or caregivers) and documentation in the medical record.  I,Emily Lagle,acting as a neurosurgeon for Emaline Onesimo, MD.,have documented all relevant documentation on the behalf of Emaline Onesimo, MD,as directed by  Emaline Onesimo, MD while in the presence of Emaline Onesimo, MD.  I have reviewed the above documentation for accuracy and completeness, and I agree with the above.  Arlee Santosuosso, MD

## 2024-01-24 LAB — MULTIPLE MYELOMA PANEL, SERUM
Albumin SerPl Elph-Mcnc: 3.9 g/dL (ref 2.9–4.4)
Albumin/Glob SerPl: 1.2 (ref 0.7–1.7)
Alpha 1: 0.2 g/dL (ref 0.0–0.4)
Alpha2 Glob SerPl Elph-Mcnc: 0.8 g/dL (ref 0.4–1.0)
B-Globulin SerPl Elph-Mcnc: 1.1 g/dL (ref 0.7–1.3)
Gamma Glob SerPl Elph-Mcnc: 1.3 g/dL (ref 0.4–1.8)
Globulin, Total: 3.4 g/dL (ref 2.2–3.9)
IgA: 335 mg/dL (ref 61–437)
IgG (Immunoglobin G), Serum: 1267 mg/dL (ref 603–1613)
IgM (Immunoglobulin M), Srm: 116 mg/dL (ref 20–172)
Total Protein ELP: 7.3 g/dL (ref 6.0–8.5)

## 2024-01-26 ENCOUNTER — Ambulatory Visit (INDEPENDENT_AMBULATORY_CARE_PROVIDER_SITE_OTHER)

## 2024-01-26 DIAGNOSIS — Z91038 Other insect allergy status: Secondary | ICD-10-CM | POA: Diagnosis not present

## 2024-01-26 LAB — KAPPA/LAMBDA LIGHT CHAINS
Kappa free light chain: 24.5 mg/L — ABNORMAL HIGH (ref 3.3–19.4)
Kappa, lambda light chain ratio: 1.39 (ref 0.26–1.65)
Lambda free light chains: 17.6 mg/L (ref 5.7–26.3)

## 2024-01-26 LAB — TRYPTASE: Tryptase: 16.5 ug/L — ABNORMAL HIGH (ref 2.2–13.2)

## 2024-02-23 ENCOUNTER — Ambulatory Visit

## 2024-02-23 DIAGNOSIS — Z91038 Other insect allergy status: Secondary | ICD-10-CM

## 2024-03-02 ENCOUNTER — Other Ambulatory Visit: Payer: Self-pay | Admitting: Allergy and Immunology

## 2024-03-02 LAB — OPHTHALMOLOGY REPORT-SCANNED

## 2024-03-02 LAB — HM DIABETES EYE EXAM

## 2024-03-22 ENCOUNTER — Ambulatory Visit

## 2024-03-22 DIAGNOSIS — Z91038 Other insect allergy status: Secondary | ICD-10-CM | POA: Diagnosis not present

## 2024-03-25 ENCOUNTER — Encounter: Payer: Self-pay | Admitting: Hematology

## 2024-03-28 NOTE — Assessment & Plan Note (Signed)
 Encourage heart healthy diet such as MIND or DASH diet, increase exercise, avoid trans fats, simple carbohydrates and processed foods, consider a krill or fish or flaxseed oil cap daily.

## 2024-03-28 NOTE — Assessment & Plan Note (Signed)
 Well controlled, no changes to meds. Encouraged heart healthy diet such as the DASH diet and exercise as tolerated.

## 2024-03-28 NOTE — Assessment & Plan Note (Signed)
 Patient does not tolerate statins.

## 2024-03-28 NOTE — Assessment & Plan Note (Signed)
 hgba1c acceptable, minimize simple carbs. Increase exercise as tolerated. Continue current meds following with endocrinology Dr Faythe

## 2024-03-28 NOTE — Progress Notes (Unsigned)
 "  Subjective:    Patient ID: Douglas Rolin Raddle., male    DOB: 1956/02/12, 69 y.o.   MRN: [id number]  No chief complaint on file.   HPI Discussed the use of AI scribe software for clinical note transcription with the patient, who gave verbal consent to proceed.  History of Present Illness     Past Medical History:  Diagnosis Date   Allergy     Anaphylaxis due to hymenoptera venom    Cataract 2016, 2017   bilateral extractions with lens implant bilaterally   Compression fracture of thoracic vertebra (HCC) T11    seen on CT   Degenerative joint disease (DJD) of lumbar spine    Diabetes mellitus without complication (HCC)    GERD (gastroesophageal reflux disease)    Hx of adenomatous colonic polyps 10/07/2018   Hyperlipidemia    Hypertension    Mast cell activation syndrome    Melanoma (HCC)    Skin cancer    several places removed, and one Moths surgery on right temple   Vasovagal syncope 2018   associated with diarrhea, hyperglycemia    Past Surgical History:  Procedure Laterality Date   APPENDECTOMY     age 26   CATARACT EXTRACTION Bilateral    and lens implanted   COLONOSCOPY     KNEE SURGERY Left 1985   LASIK Bilateral    MOHS SURGERY Right    temple   RETINAL DETACHMENT SURGERY Right    SKIN BIOPSY     multiple skin cancers removed    Family History  Problem Relation Age of Onset   Allergic rhinitis Mother    Lung cancer Mother        light smoker   Diabetes Father    Heart attack Father        multiple    Cancer - Colon Neg Hx    Esophageal cancer Neg Hx    Rectal cancer Neg Hx    Stomach cancer Neg Hx    Colon cancer Neg Hx     Social History   Socioeconomic History   Marital status: Married    Spouse name: Not on file   Number of children: 2   Years of education: Not on file   Highest education level: Associate degree: academic program  Occupational History   Occupation: Scientist, Forensic: [employer]    Comment: Holiday Representative  Tobacco Use   Smoking status: Never    Passive exposure: Never   Smokeless tobacco: Never  Vaping Use   Vaping status: Never Used  Substance and Sexual Activity   Alcohol use: Yes    Alcohol/week: 4.0 standard drinks of alcohol    Types: 4 Cans of beer per week   Drug use: No   Sexual activity: Not on file  Other Topics Concern   Not on file  Social History Narrative   Married to [name]   Never smoker   Rare EtOH   No drugs   Social Drivers of Health   Tobacco Use: Low Risk (01/20/2024)   Patient History    Smoking Tobacco Use: Never    Smokeless Tobacco Use: Never    Passive Exposure: Never  Financial Resource Strain: Low Risk (03/25/2024)   Overall Financial Resource Strain (CARDIA)    Difficulty of Paying Living Expenses: Not hard at all  Food Insecurity: No Food Insecurity (03/25/2024)   Epic    Worried About Radiation Protection Practitioner of Food in the Last  Year: Never true    Ran Out of Food in the Last Year: Never true  Transportation Needs: No Transportation Needs (03/25/2024)   Epic    Lack of Transportation (Medical): No    Lack of Transportation (Non-Medical): No  Physical Activity: Insufficiently Active (03/25/2024)   Exercise Vital Sign    Days of Exercise per Week: 2 days    Minutes of Exercise per Session: 30 min  Stress: No Stress Concern Present (03/25/2024)   [employer] of Occupational Health - Occupational Stress Questionnaire    Feeling of Stress: Not at all  Social Connections: Moderately Isolated (03/25/2024)   Social Connection and Isolation Panel    Frequency of Communication with Friends and Family: More than three times a week    Frequency of Social Gatherings with Friends and Family: More than three times a week    Attends Religious Services: Patient declined    Active Member of Clubs or Organizations: No    Attends Banker Meetings: Not on file    Marital  Status: Married  Intimate Partner Violence: Not At Risk (01/20/2024)   Epic    Fear of Current or Ex-Partner: No    Emotionally Abused: No    Physically Abused: No    Sexually Abused: No  Depression (PHQ2-9): Low Risk (01/20/2024)   Depression (PHQ2-9)    PHQ-2 Score: 0  Alcohol Screen: Low Risk (03/25/2024)   Alcohol Screen    Last Alcohol Screening Score (AUDIT): 2  Housing: Low Risk (03/25/2024)   Epic    Unable to Pay for Housing in the Last Year: No    Number of Times Moved in the Last Year: 0    Homeless in the Last Year: No  Utilities: Not At Risk (01/20/2024)   Epic    Threatened with loss of utilities: No  Health Literacy: Adequate Health Literacy (07/15/2023)   B1300 Health Literacy    Frequency of need for help with medical instructions: Never    Outpatient Medications Prior to Visit  Medication Sig Dispense Refill   amLODipine  (NORVASC ) 5 MG tablet Take 1 tablet (5 mg total) by mouth every morning. 90 tablet 4   aspirin  81 MG tablet Take 81 mg by mouth daily.     BD PEN NEEDLE NANO U/F 32G X 4 MM MISC USE ONCE DAILY TO ADMINSTER INSULIN  ONCE A DAY for 90 days     Cholecalciferol (VITAMIN D ) 50 MCG (2000 UT) tablet      cyanocobalamin (VITAMIN B12) 1000 MCG tablet Take 1,000 mcg by mouth daily.     empagliflozin  (JARDIANCE ) 25 MG TABS tablet Take 25 mg by mouth daily.      EPINEPHRINE  0.3 mg/0.3 mL IJ SOAJ injection INJECT 0.3 MG INTO THE MUSCLE AS NEEDED. 2 each 1   Evolocumab  (REPATHA  SURECLICK) 140 MG/ML SOAJ Inject 140 mg into the skin every 14 (fourteen) days. 6 mL 3   famotidine  (PEPCID ) 20 MG tablet Take 1 tablet (20 mg total) by mouth 2 (two) times daily. 180 tablet 3   Insulin  Glargine (BASAGLAR  KWIKPEN) 100 UNIT/ML Inject 40 Units into the skin daily.     loratadine  (CLARITIN ) 10 MG tablet Take 1 tablet (10 mg total) by mouth 2 (two) times daily. 180 tablet 3   losartan  (COZAAR ) 50 MG tablet Take 50 mg by mouth daily.     ONETOUCH VERIO  test strip 3 (three) times daily.     No facility-administered medications prior to visit.    Allergies[1]  Review of Systems  Constitutional:  Negative for fever and malaise/fatigue.  HENT:  Negative for congestion.   Eyes:  Negative for blurred vision.  Respiratory:  Negative for shortness of breath.   Cardiovascular:  Negative for chest pain, palpitations and leg swelling.  Gastrointestinal:  Negative for abdominal pain, blood in stool and nausea.  Genitourinary:  Negative for dysuria and frequency.  Musculoskeletal:  Negative for falls.  Skin:  Negative for rash.  Neurological:  Negative for dizziness, loss of consciousness and headaches.  Endo/Heme/Allergies:  Negative for environmental allergies.  Psychiatric/Behavioral:  Negative for depression. The patient is not nervous/anxious.        Objective:    Physical Exam Vitals reviewed.  Constitutional:      Appearance: Normal appearance. He is not ill-appearing.  HENT:     Head: Normocephalic and atraumatic.     Nose: Nose normal.  Eyes:     Conjunctiva/sclera: Conjunctivae normal.  Cardiovascular:     Rate and Rhythm: Normal rate.     Pulses: Normal pulses.     Heart sounds: Normal heart sounds. No murmur heard. Pulmonary:     Effort: Pulmonary effort is normal.     Breath sounds: Normal breath sounds. No wheezing.  Abdominal:     Palpations: Abdomen is soft. There is no mass.     Tenderness: There is no abdominal tenderness.  Musculoskeletal:     Cervical back: Normal range of motion.     Right lower leg: No edema.     Left lower leg: No edema.  Skin:    General: Skin is warm and dry.  Neurological:     General: No focal deficit present.     Mental Status: He is alert and oriented to person, place, and time.  Psychiatric:        Mood and Affect: Mood normal.    There were no vitals taken for this visit. Wt Readings from Last 3 Encounters:  01/20/24 190 lb 8 oz (86.4 kg)  12/23/23 188 lb (85.3 kg)   12/08/23 192 lb (87.1 kg)    Diabetic Foot Exam - Simple   No data filed    Lab Results  Component Value Date   WBC 5.1 01/20/2024   HGB 15.8 01/20/2024   HCT 46.8 01/20/2024   PLT 189 01/20/2024   GLUCOSE 125 (H) 01/20/2024   CHOL 86 10/08/2022   TRIG 74 10/08/2022   HDL 38 10/08/2022   LDLCALC 33 10/08/2022   ALT 22 01/20/2024   AST 23 01/20/2024   NA 141 01/20/2024   K 4.3 01/20/2024   CL 107 01/20/2024   CREATININE 0.86 01/20/2024   BUN 20 01/20/2024   CO2 27 01/20/2024   TSH 3.715 08/08/2020   PSA 1.49 10/08/2022   HGBA1C 7.7 01/20/2023    Lab Results  Component Value Date   TSH 3.715 08/08/2020   Lab Results  Component Value Date   WBC 5.1 01/20/2024   HGB 15.8 01/20/2024   HCT 46.8 01/20/2024   MCV 91.1 01/20/2024   PLT 189 01/20/2024   Lab Results  Component Value Date   NA 141 01/20/2024   K 4.3 01/20/2024   CO2 27 01/20/2024   GLUCOSE 125 (H) 01/20/2024   BUN 20 01/20/2024   CREATININE 0.86 01/20/2024   BILITOT 0.7 01/20/2024   ALKPHOS 67 01/20/2024   AST 23 01/20/2024   ALT 22 01/20/2024   PROT 7.6 01/20/2024   ALBUMIN 4.4 01/20/2024   CALCIUM  9.8 01/20/2024  ANIONGAP 7 01/20/2024   EGFR 74 05/30/2023   Lab Results  Component Value Date   CHOL 86 10/08/2022   Lab Results  Component Value Date   HDL 38 10/08/2022   Lab Results  Component Value Date   LDLCALC 33 10/08/2022   Lab Results  Component Value Date   TRIG 74 10/08/2022   Lab Results  Component Value Date   CHOLHDL 2.8 09/24/2021   Lab Results  Component Value Date   HGBA1C 7.7 01/20/2023       Assessment & Plan:  Type 2 diabetes mellitus with hyperglycemia, with long-term current use of insulin  (HCC) Assessment & Plan: hgba1c acceptable, minimize simple carbs. Increase exercise as tolerated. Continue current meds following with endocrinology Dr Faythe   Statin intolerance Assessment & Plan: Patient does not tolerate statins   Hypertension associated  with diabetes [facility]) Assessment & Plan: Well controlled, no changes to meds. Encouraged heart healthy diet such as the DASH diet and exercise as tolerated.     Mixed hyperlipidemia Assessment & Plan: Encourage heart healthy diet such as MIND or DASH diet, increase exercise, avoid trans fats, simple carbohydrates and processed foods, consider a krill or fish or flaxseed oil cap daily.       Assessment and Plan Assessment & Plan      Harlene Horton, MD     [1] Allergies Allergen Reactions   Bee Venom Anaphylaxis   Trulicity [Dulaglutide] Diarrhea and Nausea Only    Belching, stomach cramps, diarrhea   Metformin Hcl Other (See Comments)    Flushed   Nsaids Itching and Rash   Victoza [Liraglutide] Nausea Only  "

## 2024-04-01 ENCOUNTER — Encounter: Payer: Self-pay | Admitting: Family Medicine

## 2024-04-01 ENCOUNTER — Ambulatory Visit: Payer: Self-pay | Admitting: Family Medicine

## 2024-04-01 ENCOUNTER — Ambulatory Visit: Admitting: Family Medicine

## 2024-04-01 VITALS — BP 124/78 | HR 63 | Temp 97.8°F | Resp 16 | Ht 69.0 in | Wt 193.0 lb

## 2024-04-01 DIAGNOSIS — E782 Mixed hyperlipidemia: Secondary | ICD-10-CM

## 2024-04-01 DIAGNOSIS — E1159 Type 2 diabetes mellitus with other circulatory complications: Secondary | ICD-10-CM

## 2024-04-01 DIAGNOSIS — Z794 Long term (current) use of insulin: Secondary | ICD-10-CM | POA: Diagnosis not present

## 2024-04-01 DIAGNOSIS — Z789 Other specified health status: Secondary | ICD-10-CM | POA: Diagnosis not present

## 2024-04-01 DIAGNOSIS — E1165 Type 2 diabetes mellitus with hyperglycemia: Secondary | ICD-10-CM | POA: Diagnosis not present

## 2024-04-01 DIAGNOSIS — I152 Hypertension secondary to endocrine disorders: Secondary | ICD-10-CM

## 2024-04-01 LAB — CBC WITH DIFFERENTIAL/PLATELET
Basophils Absolute: 0.1 K/uL (ref 0.0–0.1)
Basophils Relative: 1 % (ref 0.0–3.0)
Eosinophils Absolute: 0.2 K/uL (ref 0.0–0.7)
Eosinophils Relative: 4.4 % (ref 0.0–5.0)
HCT: 47 % (ref 39.0–52.0)
Hemoglobin: 15.8 g/dL (ref 13.0–17.0)
Lymphocytes Relative: 33.9 % (ref 12.0–46.0)
Lymphs Abs: 1.8 K/uL (ref 0.7–4.0)
MCHC: 33.6 g/dL (ref 30.0–36.0)
MCV: 90.3 fl (ref 78.0–100.0)
Monocytes Absolute: 0.3 K/uL (ref 0.1–1.0)
Monocytes Relative: 5.9 % (ref 3.0–12.0)
Neutro Abs: 2.9 K/uL (ref 1.4–7.7)
Neutrophils Relative %: 54.8 % (ref 43.0–77.0)
Platelets: 208 K/uL (ref 150.0–400.0)
RBC: 5.21 Mil/uL (ref 4.22–5.81)
RDW: 13 % (ref 11.5–15.5)
WBC: 5.2 K/uL (ref 4.0–10.5)

## 2024-04-01 LAB — COMPREHENSIVE METABOLIC PANEL WITH GFR
ALT: 20 U/L (ref 3–53)
AST: 18 U/L (ref 5–37)
Albumin: 4.5 g/dL (ref 3.5–5.2)
Alkaline Phosphatase: 59 U/L (ref 39–117)
BUN: 17 mg/dL (ref 6–23)
CO2: 28 meq/L (ref 19–32)
Calcium: 9.7 mg/dL (ref 8.4–10.5)
Chloride: 106 meq/L (ref 96–112)
Creatinine, Ser: 0.97 mg/dL (ref 0.40–1.50)
GFR: 79.74 mL/min
Glucose, Bld: 120 mg/dL — ABNORMAL HIGH (ref 70–99)
Potassium: 4.6 meq/L (ref 3.5–5.1)
Sodium: 141 meq/L (ref 135–145)
Total Bilirubin: 0.8 mg/dL (ref 0.2–1.2)
Total Protein: 7.4 g/dL (ref 6.0–8.3)

## 2024-04-01 LAB — HEMOGLOBIN A1C: Hgb A1c MFr Bld: 8.6 % — ABNORMAL HIGH (ref 4.6–6.5)

## 2024-04-01 LAB — MICROALBUMIN / CREATININE URINE RATIO
Creatinine,U: 77.7 mg/dL
Microalb Creat Ratio: UNDETERMINED mg/g (ref 0.0–30.0)
Microalb, Ur: <0.7 mg/dL

## 2024-04-01 LAB — LIPID PANEL
Cholesterol: 83 mg/dL (ref 28–200)
HDL: 35.6 mg/dL — ABNORMAL LOW
LDL Cholesterol: 31 mg/dL (ref 10–99)
NonHDL: 47.43
Total CHOL/HDL Ratio: 2
Triglycerides: 83 mg/dL (ref 10.0–149.0)
VLDL: 16.6 mg/dL (ref 0.0–40.0)

## 2024-04-01 LAB — TSH: TSH: 2.54 u[IU]/mL (ref 0.35–5.50)

## 2024-04-01 NOTE — Patient Instructions (Addendum)
 Prevnar 20 likely once RSV, Respiratory Syncitial Virus vaccine, Arexvy vaccine  Shingrix is the new shingles shot, 2 shots over 2-6 months, confirm coverage with insurance and document, then can return here for shots with nurse appt or at pharmacy  Annual covid and flu shot Tetanus booster Pedro Dama is the new doc [facility] pharmacies are now walk in vaccine clinics M-F 9-4

## 2024-04-02 ENCOUNTER — Encounter: Payer: Self-pay | Admitting: Family Medicine

## 2024-04-02 NOTE — Progress Notes (Signed)
"  Pt reviewed via MyChart.   "

## 2024-04-19 ENCOUNTER — Ambulatory Visit

## 2024-05-17 ENCOUNTER — Ambulatory Visit

## 2024-07-13 ENCOUNTER — Inpatient Hospital Stay: Attending: Hematology

## 2024-07-27 ENCOUNTER — Inpatient Hospital Stay: Admitting: Hematology

## 2024-12-14 ENCOUNTER — Ambulatory Visit: Admitting: Allergy and Immunology
